# Patient Record
Sex: Female | Born: 1950 | Race: White | Hispanic: No | Marital: Married | Smoking: Former smoker
Health system: Southern US, Community
[De-identification: ages and names within clinical notes are randomized; demographics above are authoritative.]

## PROBLEM LIST (undated history)

## (undated) DIAGNOSIS — E785 Hyperlipidemia, unspecified: Secondary | ICD-10-CM

## (undated) DIAGNOSIS — I10 Essential (primary) hypertension: Secondary | ICD-10-CM

## (undated) DIAGNOSIS — F419 Anxiety disorder, unspecified: Secondary | ICD-10-CM

## (undated) DIAGNOSIS — M797 Fibromyalgia: Secondary | ICD-10-CM

## (undated) DIAGNOSIS — N3010 Interstitial cystitis (chronic) without hematuria: Secondary | ICD-10-CM

## (undated) DIAGNOSIS — Z862 Personal history of diseases of the blood and blood-forming organs and certain disorders involving the immune mechanism: Principal | ICD-10-CM

## (undated) DIAGNOSIS — E119 Type 2 diabetes mellitus without complications: Secondary | ICD-10-CM

## (undated) DIAGNOSIS — Z87898 Personal history of other specified conditions: Secondary | ICD-10-CM

## (undated) DIAGNOSIS — R7303 Prediabetes: Secondary | ICD-10-CM

## (undated) DIAGNOSIS — K219 Gastro-esophageal reflux disease without esophagitis: Secondary | ICD-10-CM

## (undated) DIAGNOSIS — N840 Polyp of corpus uteri: Secondary | ICD-10-CM

## (undated) DIAGNOSIS — Z972 Presence of dental prosthetic device (complete) (partial): Secondary | ICD-10-CM

## (undated) DIAGNOSIS — N281 Cyst of kidney, acquired: Secondary | ICD-10-CM

## (undated) DIAGNOSIS — K5790 Diverticulosis of intestine, part unspecified, without perforation or abscess without bleeding: Secondary | ICD-10-CM

## (undated) HISTORY — PX: COLONOSCOPY: SHX174

## (undated) HISTORY — DX: Hyperlipidemia, unspecified: E78.5

## (undated) HISTORY — DX: Diverticulosis of intestine, part unspecified, without perforation or abscess without bleeding: K57.90

## (undated) HISTORY — DX: Interstitial cystitis (chronic) without hematuria: N30.10

## (undated) HISTORY — PX: OTHER SURGICAL HISTORY: SHX169

## (undated) HISTORY — DX: Anxiety disorder, unspecified: F41.9

## (undated) HISTORY — DX: Personal history of diseases of the blood and blood-forming organs and certain disorders involving the immune mechanism: Z86.2

---

## 1974-01-11 HISTORY — PX: OTHER SURGICAL HISTORY: SHX169

## 2000-02-03 ENCOUNTER — Emergency Department (HOSPITAL_COMMUNITY): Admission: EM | Admit: 2000-02-03 | Discharge: 2000-02-03 | Payer: Self-pay | Admitting: Emergency Medicine

## 2000-03-18 ENCOUNTER — Other Ambulatory Visit: Admission: RE | Admit: 2000-03-18 | Discharge: 2000-03-18 | Payer: Self-pay | Admitting: Obstetrics and Gynecology

## 2000-08-14 ENCOUNTER — Emergency Department (HOSPITAL_COMMUNITY): Admission: EM | Admit: 2000-08-14 | Discharge: 2000-08-14 | Payer: Self-pay | Admitting: Emergency Medicine

## 2001-08-16 ENCOUNTER — Other Ambulatory Visit: Admission: RE | Admit: 2001-08-16 | Discharge: 2001-08-16 | Payer: Self-pay | Admitting: Internal Medicine

## 2002-06-08 ENCOUNTER — Emergency Department (HOSPITAL_COMMUNITY): Admission: EM | Admit: 2002-06-08 | Discharge: 2002-06-08 | Payer: Self-pay

## 2003-02-10 ENCOUNTER — Emergency Department (HOSPITAL_COMMUNITY): Admission: EM | Admit: 2003-02-10 | Discharge: 2003-02-10 | Payer: Self-pay | Admitting: Emergency Medicine

## 2003-02-20 ENCOUNTER — Ambulatory Visit (HOSPITAL_COMMUNITY): Admission: RE | Admit: 2003-02-20 | Discharge: 2003-02-20 | Payer: Self-pay | Admitting: *Deleted

## 2003-04-23 ENCOUNTER — Ambulatory Visit (HOSPITAL_COMMUNITY): Admission: RE | Admit: 2003-04-23 | Discharge: 2003-04-23 | Payer: Self-pay | Admitting: Obstetrics and Gynecology

## 2003-04-23 ENCOUNTER — Encounter (INDEPENDENT_AMBULATORY_CARE_PROVIDER_SITE_OTHER): Payer: Self-pay | Admitting: *Deleted

## 2006-11-22 ENCOUNTER — Other Ambulatory Visit: Admission: RE | Admit: 2006-11-22 | Discharge: 2006-11-22 | Payer: Self-pay | Admitting: Obstetrics and Gynecology

## 2010-02-11 ENCOUNTER — Encounter: Payer: Self-pay | Admitting: Gastroenterology

## 2010-02-11 ENCOUNTER — Other Ambulatory Visit: Payer: Self-pay | Admitting: Gastroenterology

## 2010-02-19 ENCOUNTER — Ambulatory Visit
Admission: RE | Admit: 2010-02-19 | Discharge: 2010-02-19 | Disposition: A | Discharge status: Home / Self Care | Payer: No Typology Code available for payment source | Source: Ambulatory Visit | Attending: Gastroenterology | Admitting: Gastroenterology

## 2010-02-19 ENCOUNTER — Other Ambulatory Visit: Payer: Self-pay | Admitting: Gastroenterology

## 2010-02-19 DIAGNOSIS — R935 Abnormal findings on diagnostic imaging of other abdominal regions, including retroperitoneum: Secondary | ICD-10-CM

## 2010-02-25 ENCOUNTER — Ambulatory Visit
Admission: RE | Admit: 2010-02-25 | Discharge: 2010-02-25 | Disposition: A | Discharge status: Home / Self Care | Payer: No Typology Code available for payment source | Source: Ambulatory Visit | Attending: Gastroenterology | Admitting: Gastroenterology

## 2010-02-25 DIAGNOSIS — R935 Abnormal findings on diagnostic imaging of other abdominal regions, including retroperitoneum: Secondary | ICD-10-CM

## 2010-02-25 MED ORDER — GADOBENATE DIMEGLUMINE 529 MG/ML IV SOLN: 15.0000 mL | Freq: Once | INTRAVENOUS | Status: AC | PRN

## 2010-05-29 NOTE — Op Note (Signed)
NAME:  Cathy Page, Cathy Page                        ACCOUNT NO.:  [id number]   MEDICAL RECORD NO.:  [id number]                   PATIENT TYPE:  AMB   LOCATION:  SDC                                  FACILITY:  [facility]   PHYSICIAN:  Charles A. Sydnee Cabal, MD            DATE OF BIRTH:  01/28/1950   DATE OF PROCEDURE:  04/23/2003  DATE OF DISCHARGE:                                 OPERATIVE REPORT   PREOPERATIVE DIAGNOSIS:  1. Post menopausal bleeding.  2. Endometrial polyp.   POSTOPERATIVE DIAGNOSIS:  1. Post menopausal bleeding.  2. Endometrial polyp.   PROCEDURE:  Hysteroscopy failed secondary to severely anteverted uterus,  could not pass the scope safely, paracervical block, dilation and curettage.   SURGEON:  Charles A. Delcambre, MD   ASSISTANT:  None.   COMPLICATIONS:  None.   ESTIMATED BLOOD LOSS:  Less than or equal to 20 mL.   OPERATIVE FINDINGS:  Sound to 8 cm anteverted uterus as noted above.   SPECIMENS:  Endometrial curettings/question polyp to pathology.   ANESTHESIA:  MAC plus paracervical block.   DESCRIPTION OF PROCEDURE:  The patient was taken to the operating room and  placed in supine position.  Anesthesia was dosed until adequate.  The  patient was then placed in the dorsal lithotomy position  in universal  stirrups and sterile prep and drape was undertaken.  A weighted speculum was  placed in the vagina.  The cervix was grasped with a single tooth tenaculum,  a paracervical block was placed, a total of 18 mL, with 1% plain lidocaine.  A sound was noted above, there was no evidence of perforation.  Hegar  dilators were used to dilate up to Hegar 25 without evidence of perforation.  Despite dilation, the scope could not be passed secondary to the severely  anteverted uterus.  Curved instruments had no difficulty passing, but the  straight scope would not pass.  The hysteroscopy was abandoned for that  reason.  Circumferential curet with a serrated banjo curet was  undertaken.  A moderate amount of tissue was obtained.  There was no evidence of  perforation during the procedure.  All instruments were removed and we  proceeded to take the patient to recovery with the physician in attendance.                                              Charles A. Sydnee Cabal, MD   CAD/MEDQ  D:  04/23/2003  T:  04/23/2003  Job:  [id number]

## 2010-05-29 NOTE — H&P (Signed)
NAME:  Cathy Page, Cathy Page                        ACCOUNT NO.:  [id number]   MEDICAL RECORD NO.:  [id number]                   PATIENT TYPE:  AMB   LOCATION:  SDC                                  [facility]ILITY:  [facility]   PHYSICIAN:  Charles A. Sydnee Cabal, MD            DATE OF BIRTH:  05/25/1950   DATE OF ADMISSION:  DATE OF DISCHARGE:                                HISTORY & PHYSICAL   HISTORY OF PRESENT ILLNESS:  She is a 60 year old gravida 3 para 2 female  who was noted on ultrasound to check an ovarian cyst to have an endometrium  of 6 mm.  Ovarian cyst was 5 x 6 mm, slight decrease in size from ultrasound  of January 25, 2003 on the right ovary.  This appeared stable on today's  ultrasound as well prior to the sonohysterogram which was recommended  secondary to the endometrial thickness of 6 mm.  On sonohysterogram a 20 x 9  x 15 mm large-walled polyp was noted.  I have recommended hysteroscopy D&C  based on this finding in a menopausal woman.  Endometrial biopsy was done  after the sonohysterogram; results pending.   PAST MEDICAL HISTORY:  1. Hypertension.  2. Hypothyroidism.   MEDICATIONS:  1. Synthroid 0.2 mg daily.  2. Tiazac 300 mg q.h.s.  3. Zestoretic 20/25 one q.a.m.   SURGICAL HISTORY:  1. Laparotomy for endometriosis.  2. Three C-sections.  3. Appendectomy.   ALLERGIES:  1. SULFA causes hives, swelling, and edema.  2. CODEINE - not specified.   SOCIAL HISTORY:  No tobacco, alcohol, or drugs, or STDs by history.  She is  married.   FAMILY HISTORY:  Father with diabetes mellitus, coronary artery disease, and  dementia.  Mother with hypertension, coronary artery disease.  Five sisters  alive and well.   REVIEW OF SYSTEMS:  No fever, chills, rashes, lesions, headache, dizziness.  Some seasonal allergies present.  No chest pain, shortness of breath, or  wheezing.  No diarrhea, constipation, bleeding, melena or hematochezia,  urgency, frequency, dysuria, incontinence,  hematuria, galactorrhea,  emotional changes.   PHYSICAL EXAMINATION:  GENERAL:  Alert and oriented x3, no distress.  VITAL SIGNS:  Blood pressure 160/82 on March 29, 2003.  Weight 164, heart  rate 80s, respiratory rate 18.  HEENT:  Grossly normal.  LUNGS:  Clear.  HEART:  Regular rate and rhythm.  BREASTS:  Symmetrical; otherwise deferred.  ABDOMEN:  Soft, flat, and nontender.  No masses palpable.  PELVIC:  Normal external female genitalia.  Bartholin, urethra, Skene's  within normal limits.  Vault without discharge or lesions. Urethral meatus  normal.  Urethra normal.  Bladder normal.  Vaginal normal and mildly  hypoestrogenic.  Cervix mildly hypoestrogenic, menopausal.  No cervical  motion tenderness is present.  Uterus not enlarged, anteverted.  Adnexa  nontender without masses bilaterally.  Sound was to 8 cm at time of  endometrial biopsy and sonohysterogram.  Rectal  exam not done.  External  hemorrhoids not grossly present.  Anus and perineal body appear normal.   ASSESSMENT:  Thickened endometrium in a postmenopausal woman otherwise  asymptomatic, incidental finding.  Sonohysterogram further confirms a large  endometrial polyp.   PLAN:  Hysteroscopy D&C with polypectomy.  N.p.o. past midnight.  Preoperative CBC and Chem 7.  The patient prefers MAC/paracervical  anesthesia.  Surgery scheduled for __________ via same day surgery at [facility].  All questions are answered.  She gives informed consent.  Accepts risks of  infection, bleeding, uterine perforation, bowel and bladder risks, blood  product risks including hepatitis and HIV exposure.  Will proceed as  outlined.                                               Charles A. Sydnee Cabal, MD    CAD/MEDQ  D:  04/12/2003  T:  04/12/2003  Job:  [id number]

## 2011-11-10 ENCOUNTER — Other Ambulatory Visit: Payer: Self-pay | Admitting: Gastroenterology

## 2012-10-27 ENCOUNTER — Emergency Department (HOSPITAL_COMMUNITY)
Admission: EM | Admit: 2012-10-27 | Discharge: 2012-10-28 | Disposition: A | Discharge status: Home / Self Care | Payer: BC Managed Care – PPO | Attending: Emergency Medicine | Admitting: Emergency Medicine

## 2012-10-27 ENCOUNTER — Other Ambulatory Visit: Payer: Self-pay

## 2012-10-27 ENCOUNTER — Encounter (HOSPITAL_COMMUNITY): Payer: Self-pay | Admitting: Emergency Medicine

## 2012-10-27 ENCOUNTER — Emergency Department (HOSPITAL_COMMUNITY): Payer: BC Managed Care – PPO

## 2012-10-27 DIAGNOSIS — K219 Gastro-esophageal reflux disease without esophagitis: Secondary | ICD-10-CM

## 2012-10-27 DIAGNOSIS — R079 Chest pain, unspecified: Secondary | ICD-10-CM

## 2012-10-27 DIAGNOSIS — R072 Precordial pain: Secondary | ICD-10-CM | POA: Insufficient documentation

## 2012-10-27 DIAGNOSIS — Z7982 Long term (current) use of aspirin: Secondary | ICD-10-CM | POA: Insufficient documentation

## 2012-10-27 DIAGNOSIS — I10 Essential (primary) hypertension: Secondary | ICD-10-CM | POA: Insufficient documentation

## 2012-10-27 DIAGNOSIS — Z79899 Other long term (current) drug therapy: Secondary | ICD-10-CM | POA: Insufficient documentation

## 2012-10-27 DIAGNOSIS — E119 Type 2 diabetes mellitus without complications: Secondary | ICD-10-CM | POA: Insufficient documentation

## 2012-10-27 HISTORY — DX: Essential (primary) hypertension: I10

## 2012-10-27 LAB — BASIC METABOLIC PANEL
BUN: 7 mg/dL (ref 6–23)
Chloride: 96 mEq/L (ref 96–112)
Creatinine, Ser: 0.62 mg/dL (ref 0.50–1.10)
GFR calc Af Amer: >90 mL/min (ref >90)
GFR calc non Af Amer: >90 mL/min (ref >90)
Potassium: 3.8 mEq/L (ref 3.5–5.1)

## 2012-10-27 LAB — CBC
HCT: 36.5 % (ref 36.0–46.0)
MCHC: 35.6 g/dL (ref 30.0–36.0)
Platelets: 333 10*3/uL (ref 150–400)
RDW: 12.1 % (ref 11.5–15.5)
WBC: 5.3 10*3/uL (ref 4.0–10.5)

## 2012-10-27 LAB — PRO B NATRIURETIC PEPTIDE: Pro B Natriuretic peptide (BNP): 87.5 pg/mL (ref 0–125)

## 2012-10-27 LAB — POCT I-STAT TROPONIN I

## 2012-10-27 MED ORDER — GI COCKTAIL ~~LOC~~
30.0000 mL | Freq: Once | ORAL | Status: AC
  Administered 2012-10-27: 30 mL via ORAL
  Filled 2012-10-27: qty 30

## 2012-10-27 MED ORDER — OMEPRAZOLE 20 MG PO CPDR: 20.0000 mg | DELAYED_RELEASE_CAPSULE | Freq: Every day | ORAL | Status: DC

## 2012-10-27 NOTE — ED Notes (Signed)
Pt reports that she started having midsternal chest pain with radiation into both arms. Reports some SOB and feeling nauseated. Denies any cardiac hx but reports she was placed on a holter monitor back in January.

## 2012-10-27 NOTE — ED Provider Notes (Signed)
CSN: [id number]     Arrival date & time 10/27/12  1425 History   First MD Initiated Contact with Patient 10/27/12 1631     Chief Complaint  Patient presents with  . Chest Pain   (Consider location/radiation/quality/duration/timing/severity/associated sxs/prior Treatment) HPI Patient reports developing midsternal chest pain that felt as a pressure with a burning sensation today.  It lasts approximately 45 minutes.  She was doing nothing at the time.  She denies exertional chest pain.  She's had this discomfort intermittently over the past year.  She seen her cardiologist about it had a stress test or monitor for a month without developing any reason for this.  She's had a heart catheterization before in the past demonstrating nonobstructive coronary disease.  She reports no exertional chest pain.  She denies orthopnea.  No recent fevers or chills.  No history of DVT or pulmonary blows up.  No recent long travel or surgery.  She denies unilateral leg swelling.  She states at this time she is feeling much better and her symptoms seem to be improving.  She does report that she's under a large amount of stress at this time.  She's had an endoscopy before in the past demonstrated no pathology.  She's not on any PPI.   Patient presents with Past Medical History  Diagnosis Date  . Hypertension   . Diabetes mellitus without complication    History reviewed. No pertinent past surgical history. History reviewed. No pertinent family history. History  Substance Use Topics  . Smoking status: Never Smoker   . Smokeless tobacco: Not on file  . Alcohol Use: Yes   OB History   Grav Para Term Preterm Abortions TAB SAB Ect Mult Living                 Review of Systems  All other systems reviewed and are negative.    Allergies  Sulfa antibiotics; Codeine; and Hydrocodone  Home Medications   Current Outpatient Rx  Name  Route  Sig  Dispense  Refill  . ALPRAZolam (XANAX) 1 MG tablet   Oral  Take 0.5 mg by mouth 2 (two) times daily as needed for anxiety.         Marland Kitchen aspirin 81 MG chewable tablet   Oral   Chew 81 mg by mouth daily.         . Cholecalciferol (VITAMIN D-3) 1000 UNITS CAPS   Oral   Take 3,000 Units by mouth daily.         . Coenzyme Q10 (CO Q 10 PO)   Oral   Take 1 tablet by mouth daily.         . diphenhydrAMINE (BENADRYL) 25 mg capsule   Oral   Take 25 mg by mouth daily as needed for allergies.         . fish oil-omega-3 fatty acids 1000 MG capsule   Oral   Take 2 g by mouth daily.         Marland Kitchen lisinopril (PRINIVIL,ZESTRIL) 20 MG tablet   Oral   Take 10 mg by mouth every morning.         . metFORMIN (GLUCOPHAGE) 500 MG tablet   Oral   Take 500 mg by mouth 2 (two) times daily with a meal.         . Multiple Vitamins-Minerals (MULTIVITAMIN PO)   Oral   Take 1 tablet by mouth daily.         Marland Kitchen thyroid (ARMOUR) 90 MG  tablet   Oral   Take 90 mg by mouth daily.         . vitamin C (ASCORBIC ACID) 500 MG tablet   Oral   Take 500 mg by mouth daily.         Marland Kitchen omeprazole (PRILOSEC) 20 MG capsule   Oral   Take 1 capsule (20 mg total) by mouth daily.   30 capsule   0    BP 112/68  Pulse 80  Temp(Src) 98.2 F (36.8 C) (Oral)  Resp 17  Wt 162 lb (73.483 kg)  SpO2 99% Physical Exam  Nursing note and vitals reviewed. Constitutional: She is oriented to person, place, and time. She appears well-developed and well-nourished. No distress.  HENT:  Head: Normocephalic and atraumatic.  Eyes: EOM are normal.  Neck: Normal range of motion.  Cardiovascular: Normal rate, regular rhythm and normal heart sounds.   Pulmonary/Chest: Effort normal and breath sounds normal.  Abdominal: Soft. She exhibits no distension. There is no tenderness.  Musculoskeletal: Normal range of motion.  Neurological: She is alert and oriented to person, place, and time.  Skin: Skin is warm and dry.  Psychiatric: She has a normal mood and affect. Judgment  normal.    ED Course  Procedures (including critical care time) Labs Review Labs Reviewed  BASIC METABOLIC PANEL - Abnormal; Notable for the following:    Sodium 133 (*)    Glucose, Bld 183 (*)    All other components within normal limits  CBC  PRO B NATRIURETIC PEPTIDE  TROPONIN I  POCT I-STAT TROPONIN I   Imaging Review Dg Chest 2 View  10/27/2012   CLINICAL DATA:  Left-sided chest pain.  EXAM: CHEST  2 VIEW  COMPARISON:  None  FINDINGS: Normal cardiac and mediastinal contours. Minimal left basilar atelectasis. No consolidative pulmonary opacity. No pleural effusion or pneumothorax. Regional skeleton is unremarkable.  IMPRESSION: No acute cardiopulmonary process.   Electronically Signed   By: Annia Belt M.D.   On: 10/27/2012 14:52    ECG interpretation   Date: 10/27/2012  Rate: 126  Rhythm: normal sinus rhythm  QRS Axis: normal  Intervals: normal  ST/T Wave abnormalities: normal  Conduction Disutrbances: none  Narrative Interpretation:   Old EKG Reviewed: No significant changes noted     MDM   1. Chest pain   2. GERD (gastroesophageal reflux disease)    7:14 PM Patient feels much better after GI cocktail.  Troponin negative x2.  EKG with sinus tachycardia.  Her sinus tachycardia resolved at time of discharge her heart rate was 87.    Lyanne Co, MD 10/27/12 [phone]

## 2012-11-14 DIAGNOSIS — N3010 Interstitial cystitis (chronic) without hematuria: Secondary | ICD-10-CM | POA: Insufficient documentation

## 2012-11-22 ENCOUNTER — Encounter: Payer: Self-pay | Admitting: Cardiology

## 2012-11-22 ENCOUNTER — Encounter: Payer: Self-pay | Admitting: *Deleted

## 2012-11-22 DIAGNOSIS — E119 Type 2 diabetes mellitus without complications: Secondary | ICD-10-CM | POA: Insufficient documentation

## 2012-11-22 DIAGNOSIS — I10 Essential (primary) hypertension: Secondary | ICD-10-CM | POA: Insufficient documentation

## 2012-11-23 ENCOUNTER — Encounter: Payer: Self-pay | Admitting: Cardiology

## 2012-11-23 ENCOUNTER — Ambulatory Visit (INDEPENDENT_AMBULATORY_CARE_PROVIDER_SITE_OTHER): Payer: BC Managed Care – PPO | Admitting: Cardiology

## 2012-11-23 VITALS — BP 144/100 | HR 112 | Ht 64.0 in | Wt 164.0 lb

## 2012-11-23 DIAGNOSIS — R079 Chest pain, unspecified: Secondary | ICD-10-CM

## 2012-11-23 DIAGNOSIS — I10 Essential (primary) hypertension: Secondary | ICD-10-CM

## 2012-11-23 DIAGNOSIS — E785 Hyperlipidemia, unspecified: Secondary | ICD-10-CM | POA: Insufficient documentation

## 2012-11-23 DIAGNOSIS — R002 Palpitations: Secondary | ICD-10-CM

## 2012-11-23 LAB — BASIC METABOLIC PANEL
BUN: 13 mg/dL (ref 6–23)
CO2: 28 mEq/L (ref 19–32)
Calcium: 9.3 mg/dL (ref 8.4–10.5)
Chloride: 102 mEq/L (ref 96–112)
Creatinine, Ser: 0.8 mg/dL (ref 0.4–1.2)

## 2012-11-23 LAB — CBC
HCT: 39.7 % (ref 36.0–46.0)
MCV: 89.3 fl (ref 78.0–100.0)
RDW: 13 % (ref 11.5–14.6)

## 2012-11-23 LAB — TSH: TSH: 0.13 u[IU]/mL — ABNORMAL LOW (ref 0.35–5.50)

## 2012-11-23 NOTE — Patient Instructions (Signed)
Your physician recommends that you go to the lab today for a BMET, CBC, and TSH  Your physician has requested that you have en exercise stress myoview. For further information please visit [email or web address] Please follow instruction sheet, as given.  Your physician has recommended that you wear an event monitor. Event monitors are medical devices that record the heart's electrical activity. Doctors most often [place] these monitors to diagnose arrhythmias. Arrhythmias are problems with the speed or rhythm of the heartbeat. The monitor is a small, portable device. You can wear one while you do your normal daily activities. This is usually used to diagnose what is causing palpitations/syncope (passing out).

## 2012-11-23 NOTE — Progress Notes (Signed)
[address] [place], [place]  [address] Phone: [phone] Fax:  [phone]  Date:  11/23/2012   ID:  BRECKLYNN JIAN, DOB 10/19/1950, MRN [id number]  PCP:  Elie Goody, NP  Cardiologist:  Armanda Magic, MD   HPI:  This is a 62yo female with a history of HTN who recently went to the ER.  The patient reports developing midsternal chest pain that felt as a pressure with a burning sensation. It lasted approximately 45 minutes. She was doing nothing at the time. She denied exertional chest pain. She had this discomfort intermittently over the past year. She has had a stress test and had a heart catheterization before in the past demonstrating nonobstructive coronary disease quite a while ago. She has some mild nausea and SOB at the time.  She reported no exertional chest pain. She denied orthopnea. No recent fevers or chills. No history of DVT or pulmonary blows up. No recent long travel or surgery. She does report that she's under a large amount of stress at this time. Her ER workup was normal and she was referred for further evaluation.  Since the Er visit she has not had any further CP or SOB.  She also says that she was having palpitations and was told that her HR was in the 120's.  She occasionally gets a sharp pain in her epigastrium that radiates into her back.  She says that she is under tremendous.   History of Present Illness:  Wt Readings from Last 3 Encounters:  11/23/12 164 lb (74.39 kg)  10/27/12 162 lb (73.483 kg)     Past Medical History  Diagnosis Date  . Hypertension   . Diabetes mellitus without complication   . Hyperlipidemia   . Hypercholesteremia   . Hypothyroidism   . Anxiety   . Abdominal pain, left lower quadrant   . Diverticulosis   . Hypothyroidism     Current Outpatient Prescriptions  Medication Sig Dispense Refill  . ALPRAZolam (XANAX) 1 MG tablet Take 0.5 mg by mouth 2 (two) times daily as needed for anxiety.      Marland Kitchen aspirin 81 MG  chewable tablet Chew 81 mg by mouth daily.      . Cholecalciferol (VITAMIN D-3) 1000 UNITS CAPS Take 3,000 Units by mouth daily.      . Coenzyme Q10 (CO Q 10 PO) Take 1 tablet by mouth daily.      . diphenhydrAMINE (BENADRYL) 25 mg capsule Take 25 mg by mouth daily as needed for allergies.      . fish oil-omega-3 fatty acids 1000 MG capsule Take 2 g by mouth daily.      Marland Kitchen lisinopril (PRINIVIL,ZESTRIL) 20 MG tablet Take 10 mg by mouth every morning.      . metFORMIN (GLUCOPHAGE) 500 MG tablet Take 500 mg by mouth 2 (two) times daily with a meal.      . Multiple Vitamins-Minerals (MULTIVITAMIN PO) Take 1 tablet by mouth daily.      Marland Kitchen omeprazole (PRILOSEC) 20 MG capsule Take 1 capsule (20 mg total) by mouth daily.  30 capsule  0  . thyroid (ARMOUR) 90 MG tablet Take 90 mg by mouth daily.      . vitamin C (ASCORBIC ACID) 500 MG tablet Take 500 mg by mouth daily.       No current facility-administered medications for this visit.    Allergies:    Allergies  Allergen Reactions  . Sulfa Antibiotics Anaphylaxis  . Codeine Nausea  And Vomiting  . Hydrocodone Nausea And Vomiting    Social History:  The patient  reports that she has never smoked. She does not have any smokeless tobacco history on file. She reports that she drinks alcohol. She reports that she does not use illicit drugs.   Family History:  The patient's family history includes CAD in her mother; Diabetes in her father.   ROS:  Please see the history of present illness.      All other systems reviewed and negative.   PHYSICAL EXAM: VS:  BP 144/100  Pulse 112  Ht 5\' 4"  (1.626 m)  Wt 164 lb (74.39 kg)  BMI 28.14 kg/m2 Well nourished, well developed, in no acute distress HEENT: normal Neck: no JVD Cardiac:  normal S1, S2; RRR; no murmurtachy Lungs:  clear to auscultation bilaterally, no wheezing, rhonchi or rales Abd: soft, nontender, no hepatomegaly Ext: no edema Skin: warm and dry Neuro:  CNs 2-12 intact, no focal  abnormalities noted       ASSESSMENT AND PLAN:  1. Chest pain - now resolved   - stress myoview to rule out ischemia 2. SOB - now resolved 3. HTN - BP is elevated but patient is very stressed and anxious today  - continue Lisinopril 4. Palpitations ? Secondary to stress and anxiety  - event monitor  - check TSH/CBC/BMET  Followup with me PRN if stress test was fine

## 2012-11-24 ENCOUNTER — Telehealth: Payer: Self-pay | Admitting: Hematology and Oncology
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
## 2012-11-24 NOTE — Telephone Encounter (Signed)
C/D 11/24/12 for appt. 11/30/12

## 2012-11-24 NOTE — Telephone Encounter (Signed)
S/W PT IN REF TO NP APPT. ON 11/30/12@9 :30 REFERRING DR Consuelo Pandy DX-ELEVATED WBC'S,LIVER ENZYMES,LIPOPROTEIN (A) MAILED NP PACKET

## 2012-11-27 ENCOUNTER — Other Ambulatory Visit: Payer: Self-pay | Admitting: Nurse Practitioner

## 2012-11-27 DIAGNOSIS — R109 Unspecified abdominal pain: Secondary | ICD-10-CM

## 2012-11-27 DIAGNOSIS — R102 Pelvic and perineal pain: Secondary | ICD-10-CM

## 2012-11-28 ENCOUNTER — Ambulatory Visit (INDEPENDENT_AMBULATORY_CARE_PROVIDER_SITE_OTHER): Payer: BC Managed Care – PPO | Admitting: *Deleted

## 2012-11-28 ENCOUNTER — Ambulatory Visit (HOSPITAL_COMMUNITY): Payer: BC Managed Care – PPO

## 2012-11-28 ENCOUNTER — Encounter: Payer: Self-pay | Admitting: Radiology

## 2012-11-28 ENCOUNTER — Ambulatory Visit (HOSPITAL_COMMUNITY): Payer: BC Managed Care – PPO | Attending: Cardiology | Admitting: Radiology

## 2012-11-28 VITALS — BP 120/96 | Ht 64.0 in | Wt 160.0 lb

## 2012-11-28 DIAGNOSIS — Z8249 Family history of ischemic heart disease and other diseases of the circulatory system: Secondary | ICD-10-CM | POA: Insufficient documentation

## 2012-11-28 DIAGNOSIS — R002 Palpitations: Secondary | ICD-10-CM | POA: Insufficient documentation

## 2012-11-28 DIAGNOSIS — E119 Type 2 diabetes mellitus without complications: Secondary | ICD-10-CM | POA: Insufficient documentation

## 2012-11-28 DIAGNOSIS — R42 Dizziness and giddiness: Secondary | ICD-10-CM | POA: Insufficient documentation

## 2012-11-28 DIAGNOSIS — R079 Chest pain, unspecified: Secondary | ICD-10-CM

## 2012-11-28 DIAGNOSIS — I10 Essential (primary) hypertension: Secondary | ICD-10-CM | POA: Insufficient documentation

## 2012-11-28 DIAGNOSIS — I4949 Other premature depolarization: Secondary | ICD-10-CM

## 2012-11-28 DIAGNOSIS — R0789 Other chest pain: Secondary | ICD-10-CM | POA: Insufficient documentation

## 2012-11-28 DIAGNOSIS — E785 Hyperlipidemia, unspecified: Secondary | ICD-10-CM | POA: Insufficient documentation

## 2012-11-28 DIAGNOSIS — R0989 Other specified symptoms and signs involving the circulatory and respiratory systems: Secondary | ICD-10-CM | POA: Insufficient documentation

## 2012-11-28 DIAGNOSIS — R0609 Other forms of dyspnea: Secondary | ICD-10-CM | POA: Insufficient documentation

## 2012-11-28 DIAGNOSIS — R0602 Shortness of breath: Secondary | ICD-10-CM

## 2012-11-28 MED ORDER — TECHNETIUM TC 99M SESTAMIBI GENERIC - CARDIOLITE
11.0000 | Freq: Once | INTRAVENOUS | Status: AC | PRN
  Administered 2012-11-28: 11 via INTRAVENOUS

## 2012-11-28 MED ORDER — TECHNETIUM TC 99M SESTAMIBI GENERIC - CARDIOLITE
33.0000 | Freq: Once | INTRAVENOUS | Status: AC | PRN
  Administered 2012-11-28: 33 via INTRAVENOUS

## 2012-11-28 NOTE — Progress Notes (Deleted)
Exercise Treadmill Test  Pre-Exercise Testing Evaluation Rhythm: normal sinus  Rate: 76 bpm     Test  Exercise Tolerance Test Ordering MD: Angelina Sheriff, MD  Interpreting MD: Norma Fredrickson, NP  Unique Test No: 1  Treadmill:  1  Indication for ETT: known ASHD  Contraindication to ETT: No   Stress Modality: exercise - treadmill  Cardiac Imaging Performed: non   Protocol: standard Bruce - maximal  Max BP:  ***/***  Max MPHR (bpm):  159 85% MPR (bpm):  135  MPHR obtained (bpm):  *** % MPHR obtained:  ***  Reached 85% MPHR (min:sec):  *** Total Exercise Time (min-sec):  ***  Workload in METS:  *** Borg Scale: ***  Reason ETT Terminated:  {CHL REASON TERMINATED FOR ZOX:09604540}    ST Segment Analysis At Rest: {CHL ST SEGMENT AT REST FOR JWJ:19147829} With Exercise: {CHL ST SEGMENT WITH EXERCISE FOR FAO:13086578}  Other Information Arrhythmia:  {CHL ARRHYTHMIA FOR ION:62952841} Angina during ETT:  {CHL ANGINA DURING LKG:40102725} Quality of ETT:  {CHL QUALITY OF DGU:44034742}  ETT Interpretation:  {CHL INTERPRETATION FOR VZD:63875643}  Comments: ***  Recommendations: ***

## 2012-11-28 NOTE — Progress Notes (Signed)
Patient ID: Cathy Page, female   DOB: 09-22-1950, 62 y.o.   MRN: [id number] lifewatch 30 day monitor applied

## 2012-11-28 NOTE — Progress Notes (Signed)
[facility] SITE 3 NUCLEAR MED [address] [place], [place] [address] [phone]    Cardiology Nuclear Med Study  Cathy Page is a 62 y.o. female     MRN : [id number]     DOB: [birth year]  Procedure Date: 11/28/2012  Nuclear Med Background Indication for Stress Test:  Evaluation for Ischemia History:  '09 MPI: normal Cardiac Risk Factors: Family History - CAD, Hypertension, Lipids and NIDDM  Symptoms:  Chest Pain at Rest and with Exertion (last date of chest discomfort 4 days ago), Dizziness, DOE and Palpitations   Nuclear Pre-Procedure Caffeine/Decaff Intake:  None > 12 hrs NPO After: 8:00am   Lungs:  clear O2 Sat: 94% on room air. IV 0.9% NS with Angio Cath:  22g  IV Site: R Antecubital x 1, tolerated well IV Started by:  Irean Hong, RN  Chest Size (in):  38 Cup Size: DD  Height: 5\' 4"  (1.626 m)  Weight:  160 lb (72.576 kg)  BMI:  Body mass index is 27.45 kg/(m^2). Tech Comments:  Took Metformin with breakfast    Nuclear Med Study 1 or 2 day study: 1 day  Stress Test Type:  Stress  Reading MD: Armanda Magic, MD  Order Authorizing Provider:  Armanda Magic, MD  Resting Radionuclide: Technetium 28m Sestamibi  Resting Radionuclide Dose: 10.5 mCi   Stress Radionuclide:  Technetium 54m Sestamibi  Stress Radionuclide Dose: 32.8 mCi           Stress Protocol Rest HR: 89 Stress HR: 141  Rest BP: 120/96 Stress BP: 149/78  Exercise Time (min): 7:01 METS: 8.5   Predicted Max HR: 156 bpm % Max HR: 90.38 bpm Rate Pressure Product: 21308   Dose of Adenosine (mg):  n/a Dose of Lexiscan: n/a mg  Dose of Atropine (mg): n/a Dose of Dobutamine: n/a mcg/kg/min (at max HR)  Stress Test Technologist: Cathlyn Parsons, RN  Nuclear Technologist:  Dario Guardian, CNMT     Rest Procedure:  Myocardial perfusion imaging was performed at rest 45 minutes following the intravenous administration of Technetium 21m Sestamibi. Rest ECG: NSR with PVC's  Stress Procedure:   The patient exercised on the treadmill utilizing the Bruce Protocol for 7:01 minutes. The patient stopped due to fatigue and moderate SOB and denied any chest pain.  Technetium 52m Sestamibi was injected at peak exercise and myocardial perfusion imaging was performed after a brief delay. Stress ECG: No significant change from baseline ECG  QPS Raw Data Images:  Normal; no motion artifact; normal heart/lung ratio. Stress Images:  Normal homogeneous uptake in all areas of the myocardium. Rest Images:  Normal homogeneous uptake in all areas of the myocardium. Subtraction (SDS):  No evidence of ischemia. Transient Ischemic Dilatation (Normal <1.22):  0.91 Lung/Heart Ratio (Normal <0.45):  0.34  Quantitative Gated Spect Images QGS EDV:  69 ml QGS ESV:  21 ml  Impression Exercise Capacity:  Good exercise capacity. BP Response:  Normal blood pressure response. Clinical Symptoms:  There is dyspnea. ECG Impression:  No significant ST segment change suggestive of ischemia. Comparison with Prior Nuclear Study: No images to compare  Overall Impression:  Normal stress nuclear study.  LV Ejection Fraction: 69%.  LV Wall Motion:  NL LV Function; NL Wall Motion  Signed: Armanda Magic, MD 11/29/2012

## 2012-11-30 ENCOUNTER — Encounter: Payer: Self-pay | Admitting: Hematology and Oncology

## 2012-11-30 ENCOUNTER — Ambulatory Visit: Payer: BC Managed Care – PPO

## 2012-11-30 ENCOUNTER — Encounter (INDEPENDENT_AMBULATORY_CARE_PROVIDER_SITE_OTHER): Payer: Self-pay

## 2012-11-30 ENCOUNTER — Ambulatory Visit (HOSPITAL_BASED_OUTPATIENT_CLINIC_OR_DEPARTMENT_OTHER): Payer: BC Managed Care – PPO | Admitting: Hematology and Oncology

## 2012-11-30 VITALS — BP 138/76 | HR 88 | Temp 97.8°F | Resp 20 | Ht 64.0 in | Wt 164.0 lb

## 2012-11-30 DIAGNOSIS — E785 Hyperlipidemia, unspecified: Secondary | ICD-10-CM

## 2012-11-30 DIAGNOSIS — R748 Abnormal levels of other serum enzymes: Secondary | ICD-10-CM

## 2012-11-30 DIAGNOSIS — Z862 Personal history of diseases of the blood and blood-forming organs and certain disorders involving the immune mechanism: Secondary | ICD-10-CM | POA: Insufficient documentation

## 2012-11-30 HISTORY — DX: Personal history of diseases of the blood and blood-forming organs and certain disorders involving the immune mechanism: Z86.2

## 2012-11-30 NOTE — Progress Notes (Signed)
[facility] CONSULT NOTE  Patient Care Team: Elie Goody as PCP - General (Nurse Practitioner) Artis Delay, MD as Consulting Physician (Hematology and Oncology)  CHIEF COMPLAINTS/PURPOSE OF CONSULTATION:  Abnormal CBC with leukocytosis and neutrophilia  HISTORY OF PRESENTING ILLNESS:  Cathy Page 62 y.o. female is here because of abnormal CBC. According to the patient, she had CBC drawn on a routine basis. According to chart review, CBC collected on 10/25/2012 show a white count of uterine 0.3, 8.9 absolute neutrophilia, C-reactive protein at 146, elevated alkaline phosphatase, ALT, GGT, and lipoprotein (a) At the time the blood was drawn, she was complaining of dysuria, frequency and urgency. According to the patient, she had history of intermittent interstitial cystitis and has seen a urologist recently. Repeat urinalysis came back positive for blood in the urine, in association with mild tachycardia, she was diagnosed with possible urinary tract infection and was placed on Macrobid for 5 days She denies any further urinary symptoms today. She denies any recent fever, chills, night sweats or abnormal weight loss  MEDICAL HISTORY:  Past Medical History  Diagnosis Date  . Diabetes mellitus without complication   . Hypothyroidism   . Anxiety   . Abdominal pain, left lower quadrant   . Diverticulosis   . Hypothyroidism   . Hypertension   . Hyperlipidemia   . Hypercholesteremia   . Interstitial cystitis     Chronic, intermittent    SURGICAL HISTORY: Past Surgical History  Procedure Laterality Date  . Cesarean section    . Appendectomy    . Laparatomy-endometriosis    . Cardiac catheterization      SOCIAL HISTORY: History   Social History  . Marital Status: Married    Spouse Name: N/A    Number of Children: N/A  . Years of Education: N/A   Occupational History  . Not on file.   Social History Main Topics  . Smoking status: Never Smoker   .  Smokeless tobacco: Never Used  . Alcohol Use: 3.0 oz/week    5 Glasses of wine per week  . Drug Use: No  . Sexual Activity: Not on file   Other Topics Concern  . Not on file   Social History Narrative  . No narrative on file    FAMILY HISTORY: Family History  Problem Relation Age of Onset  . Diabetes Father   . CAD Mother     ALLERGIES:  is allergic to sulfa antibiotics; codeine; and hydrocodone.  MEDICATIONS:  Current Outpatient Prescriptions  Medication Sig Dispense Refill  . ALPRAZolam (XANAX) 1 MG tablet Take 0.5 mg by mouth 2 (two) times daily as needed for anxiety.      Marland Kitchen aspirin 81 MG chewable tablet Chew 81 mg by mouth daily.      . Cholecalciferol (VITAMIN D-3) 1000 UNITS CAPS Take 3,000 Units by mouth daily.      . Coenzyme Q10 (CO Q 10 PO) Take 1 tablet by mouth daily.      . diphenhydrAMINE (BENADRYL) 25 mg capsule Take 25 mg by mouth daily as needed for allergies.      . Estradiol 0.5 MG/0.5GM GEL Place onto the skin.      . fish oil-omega-3 fatty acids 1000 MG capsule Take 2 g by mouth daily.      Marland Kitchen liothyronine (CYTOMEL) 5 MCG tablet Take 5 mcg by mouth daily.      Marland Kitchen lisinopril (PRINIVIL,ZESTRIL) 20 MG tablet Take 10 mg by mouth every morning.      Marland Kitchen  metFORMIN (GLUCOPHAGE) 500 MG tablet Take 500 mg by mouth 2 (two) times daily with a meal.      . Multiple Vitamins-Minerals (MULTIVITAMIN PO) Take 1 tablet by mouth daily.      . progesterone (PROMETRIUM) 100 MG capsule Take 100 mg by mouth daily.      . Red Yeast Rice 600 MG CAPS Take 2 capsules by mouth.      . thyroid (ARMOUR) 90 MG tablet Take 90 mg by mouth daily.      Marland Kitchen omeprazole (PRILOSEC) 20 MG capsule Take 1 capsule (20 mg total) by mouth daily.  30 capsule  0  . vitamin C (ASCORBIC ACID) 500 MG tablet Take 500 mg by mouth daily.       No current facility-administered medications for this visit.    REVIEW OF SYSTEMS:   Constitutional: Denies fevers, chills or abnormal night sweats Eyes: Denies  blurriness of vision, double vision or watery eyes Ears, nose, mouth, throat, and face: Denies mucositis or sore throat Respiratory: Denies cough, dyspnea or wheezes Cardiovascular: Denies palpitation, chest discomfort or lower extremity swelling Gastrointestinal:  Denies nausea, heartburn or change in bowel habits Skin: Denies abnormal skin rashes Lymphatics: Denies new lymphadenopathy or easy bruising Neurological:Denies numbness, tingling or new weaknesses Behavioral/Psych: Mood is stable, no new changes  All other systems were reviewed with the patient and are negative.  PHYSICAL EXAMINATION: ECOG PERFORMANCE STATUS: 0 - Asymptomatic  Filed Vitals:   11/30/12 0941  BP: 138/76  Pulse: 88  Temp: 97.8 F (36.6 C)  Resp: 20   Filed Weights   11/30/12 0941  Weight: 164 lb (74.39 kg)    GENERAL:alert, no distress and comfortable SKIN: skin color, texture, turgor are normal, no rashes or significant lesions EYES: normal, conjunctiva are pink and non-injected, sclera clear OROPHARYNX:no exudate, no erythema and lips, buccal mucosa, and tongue normal  NECK: supple, thyroid normal size, non-tender, without nodularity LYMPH:  no palpable lymphadenopathy in the cervical, axillary or inguinal LUNGS: clear to auscultation and percussion with normal breathing effort HEART: regular rate & rhythm and no murmurs and no lower extremity edema ABDOMEN:abdomen soft, non-tender and normal bowel sounds Musculoskeletal:no cyanosis of digits and no clubbing  PSYCH: alert & oriented x 3 with fluent speech NEURO: no focal motor/sensory deficits  LABORATORY DATA:  I have reviewed the data as listed Recent Results (from the past 2160 hour(s))  BASIC METABOLIC PANEL     Status: Abnormal   Collection Time    10/27/12  2:31 PM      Result Value Range   Sodium 133 (*) 135 - 145 mEq/L   Potassium 3.8  3.5 - 5.1 mEq/L   Chloride 96  96 - 112 mEq/L   CO2 25  19 - 32 mEq/L   Glucose, Bld 183 (*) 70  - 99 mg/dL   BUN 7  6 - 23 mg/dL   Creatinine, Ser 1.61  0.50 - 1.10 mg/dL   Calcium 9.1  8.4 - 09.6 mg/dL   GFR calc non Af Amer >90  >90 mL/min   GFR calc Af Amer >90  >90 mL/min   Comment: (NOTE)     The eGFR has been calculated using the CKD EPI equation.     This calculation has not been validated in all clinical situations.     eGFR's persistently <90 mL/min signify possible Chronic Kidney     Disease.  PRO B NATRIURETIC PEPTIDE     Status: None   Collection  Time    10/27/12  2:31 PM      Result Value Range   Pro B Natriuretic peptide (BNP) 87.5  0 - 125 pg/mL  POCT I-STAT TROPONIN I     Status: None   Collection Time    10/27/12  2:41 PM      Result Value Range   Troponin i, poc 0.00  0.00 - 0.08 ng/mL   Comment 3            Comment: Due to the release kinetics of cTnI,     a negative result within the first hours     of the onset of symptoms does not rule out     myocardial infarction with certainty.     If myocardial infarction is still suspected,     repeat the test at appropriate intervals.  CBC     Status: None   Collection Time    10/27/12  2:55 PM      Result Value Range   WBC 5.3  4.0 - 10.5 K/uL   RBC 4.09  3.87 - 5.11 MIL/uL   Hemoglobin 13.0  12.0 - 15.0 g/dL   HCT 16.1  09.6 - 04.5 %   MCV 89.2  78.0 - 100.0 fL   MCH 31.8  26.0 - 34.0 pg   MCHC 35.6  30.0 - 36.0 g/dL   RDW 40.9  81.1 - 91.4 %   Platelets 333  150 - 400 K/uL  TROPONIN I     Status: None   Collection Time    10/27/12  5:18 PM      Result Value Range   Troponin I <0.30  <0.30 ng/mL   Comment:            Due to the release kinetics of cTnI,     a negative result within the first hours     of the onset of symptoms does not rule out     myocardial infarction with certainty.     If myocardial infarction is still suspected,     repeat the test at appropriate intervals.  BASIC METABOLIC PANEL     Status: Abnormal   Collection Time    11/23/12  3:11 PM      Result Value Range   Sodium  137  135 - 145 mEq/L   Potassium 3.9  3.5 - 5.1 mEq/L   Chloride 102  96 - 112 mEq/L   CO2 28  19 - 32 mEq/L   Glucose, Bld 100 (*) 70 - 99 mg/dL   BUN 13  6 - 23 mg/dL   Creatinine, Ser 0.8  0.4 - 1.2 mg/dL   Calcium 9.3  8.4 - 78.2 mg/dL   GFR 95.62  >13.08 mL/min  CBC     Status: None   Collection Time    11/23/12  3:11 PM      Result Value Range   WBC 8.3  4.5 - 10.5 K/uL   RBC 4.45  3.87 - 5.11 Mil/uL   Platelets 268.0  150.0 - 400.0 K/uL   Hemoglobin 13.8  12.0 - 15.0 g/dL   HCT 65.7  84.6 - 96.2 %   MCV 89.3  78.0 - 100.0 fl   MCHC 34.7  30.0 - 36.0 g/dL   RDW 95.2  84.1 - 32.4 %  TSH     Status: Abnormal   Collection Time    11/23/12  3:11 PM      Result Value Range  TSH 0.13 (*) 0.35 - 5.50 uIU/mL   ASSESSMENT:  #1 recent leukocytosis, resolved #2 history of interstitial cystitis  PLAN:  #1 recent leukocytosis, resolved #2 history of interstitial cystitis I reassured the patient. Her most recent 2 CBC showed normal white count. It is possible at the time her CBC was drawn, she had evidence of urinary tract infection. That would explain the elevated CRP. She was treated with antibiotics and this has resolved. No further workup is needed. #3 abnormal liver function test #4 hyperlipidemia The patient had significant alcohol intake on a regular basis. She was told to take wine for cardiac protection. I suspect the elevated liver function tests is related to recent alcohol intake. I recommend she reduce alcohol intake. I recommend she followup with her primary care provider to have her liver function tests rechecked in the near future. The hyperlipidemia could sometimes also cause abnormal liver function tests do to fatty liver change. I reassured the patient   All questions were answered. The patient knows to call the clinic with any problems, questions or concerns. I spent 30 minutes counseling the patient face to face. The total time spent in the appointment was 40  minutes and more than 50% was on counseling.     Maclin Guerrette, MD 11/30/2012 10:51 AM

## 2012-12-01 ENCOUNTER — Ambulatory Visit
Admission: RE | Admit: 2012-12-01 | Discharge: 2012-12-01 | Disposition: A | Discharge status: Home / Self Care | Payer: BC Managed Care – PPO | Source: Ambulatory Visit | Attending: Nurse Practitioner | Admitting: Nurse Practitioner

## 2012-12-01 DIAGNOSIS — R102 Pelvic and perineal pain: Secondary | ICD-10-CM

## 2012-12-01 DIAGNOSIS — R109 Unspecified abdominal pain: Secondary | ICD-10-CM

## 2013-01-15 ENCOUNTER — Telehealth: Payer: Self-pay | Admitting: Cardiology

## 2013-01-15 NOTE — Telephone Encounter (Signed)
LVm for pt to return call.  

## 2013-01-15 NOTE — Telephone Encounter (Signed)
Please let patient know that heart monitor showed normal rhythm with occasional extra heart beats from the bottom of the heart which are benign

## 2013-01-23 NOTE — Telephone Encounter (Signed)
That is fine 

## 2013-01-23 NOTE — Telephone Encounter (Signed)
Pt is aware. Pt stated when she lays down she feels like she can hear her heart. She wanted to know if that was ok? She also stated she was going to start walking again.

## 2013-01-24 NOTE — Telephone Encounter (Signed)
Pt is aware.  

## 2014-01-10 IMAGING — US US ABDOMEN COMPLETE
1 series · 14 of 25 positions shown · non-contrast
Comparison: MRI liver 02/25/2010, abdominal ultrasound 02/19/2010

CLINICAL DATA: Abdominal pain

EXAM:
ULTRASOUND ABDOMEN COMPLETE

[Series 1: us abdomen complete · 0.28mm/px · 14 of 82 slices shown]
[im 1/82]
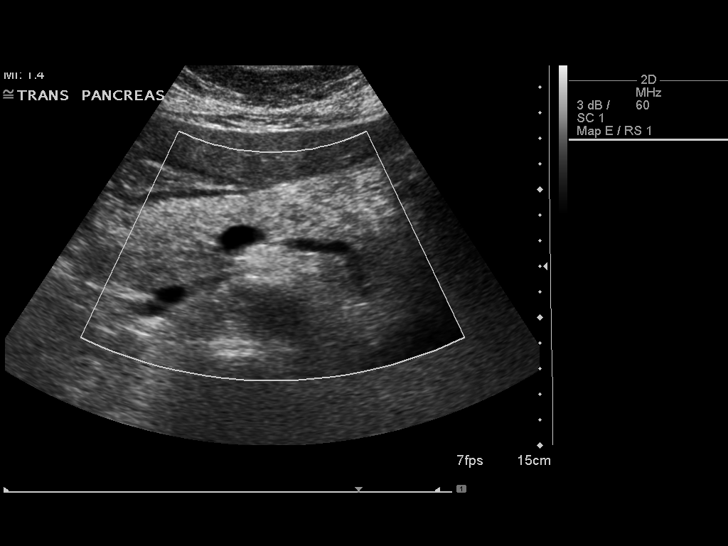
[im 7/82]
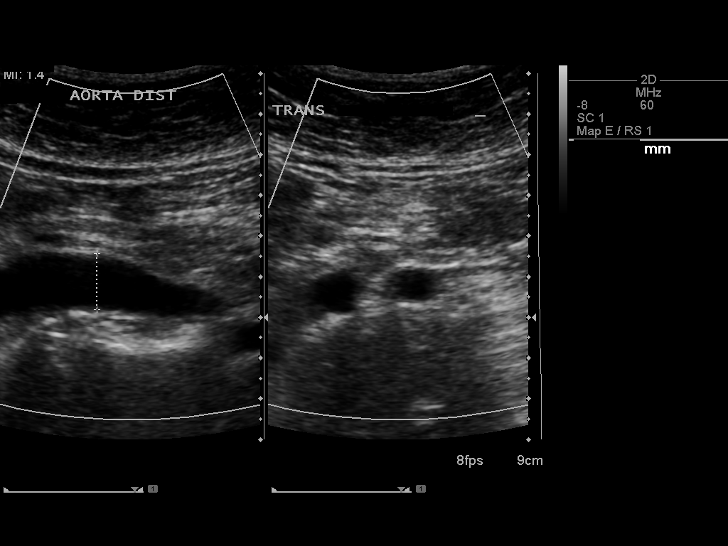
[im 14/82]
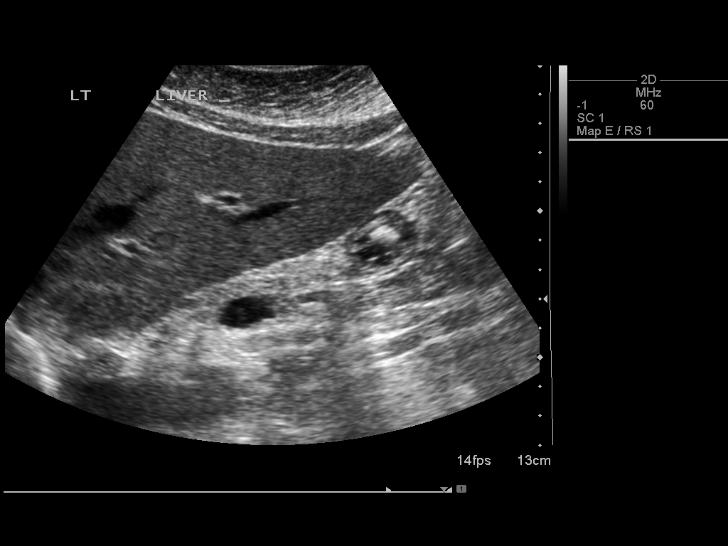
[im 21/82]
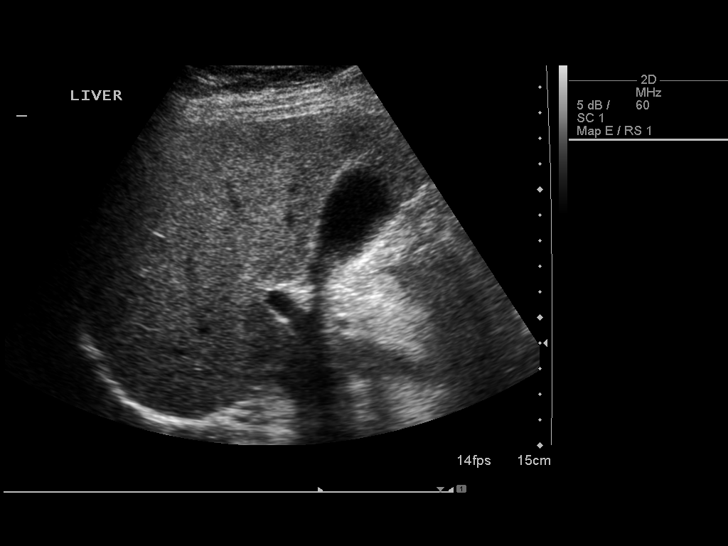
[im 28/82]
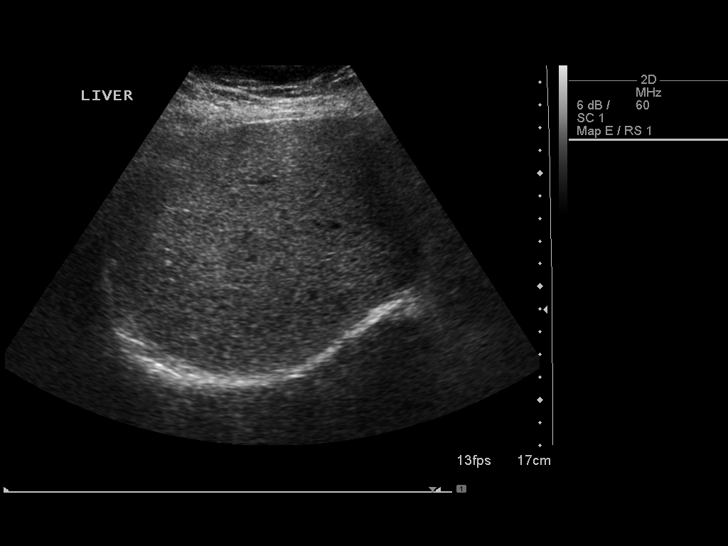
[im 31/82]
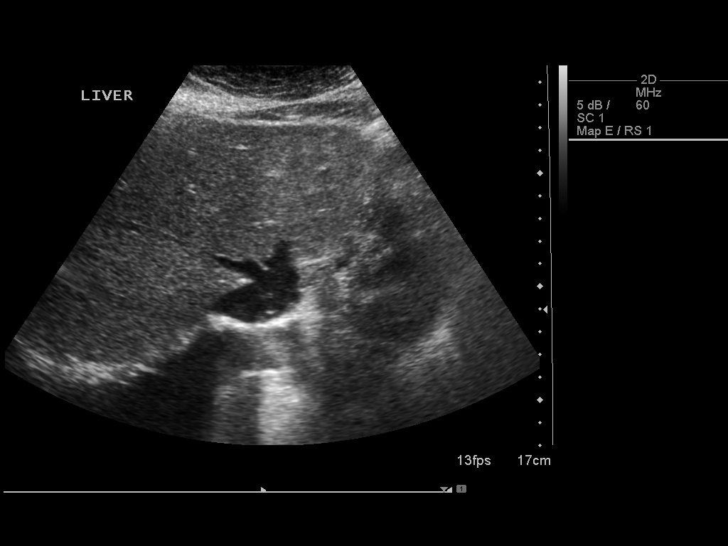
[im 38/82]
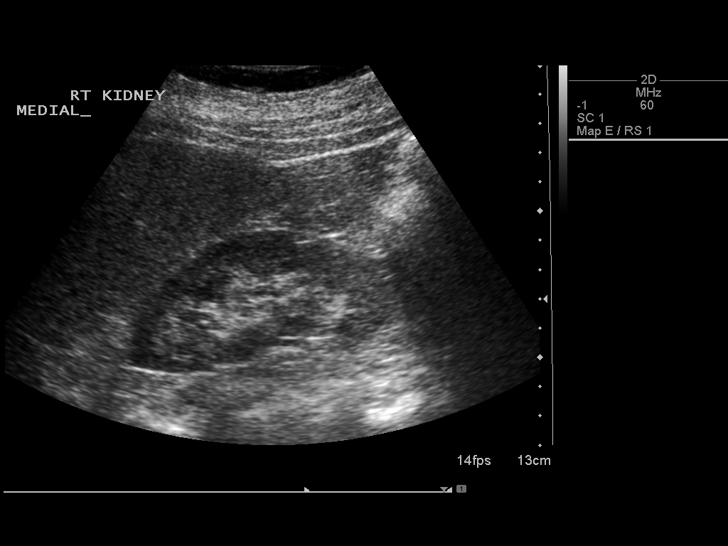
[im 44/82]
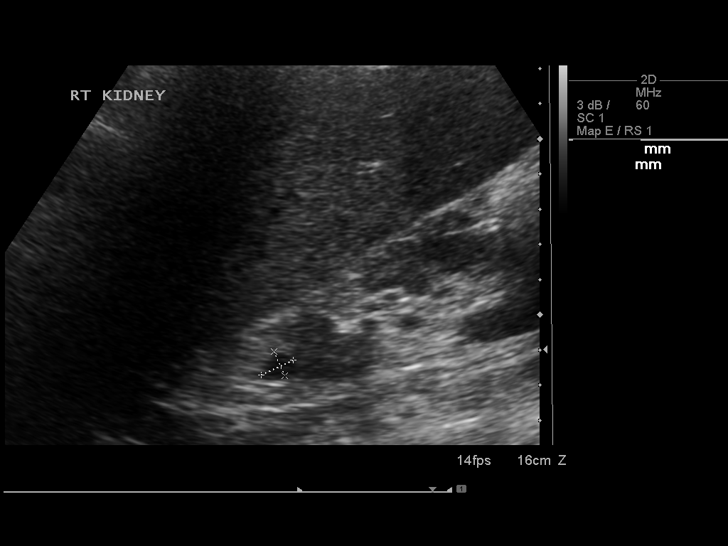
[im 51/82]
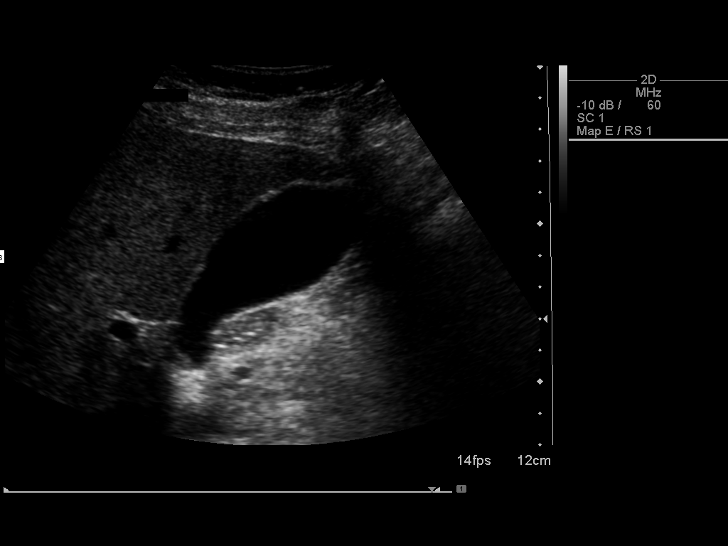
[im 55/82]
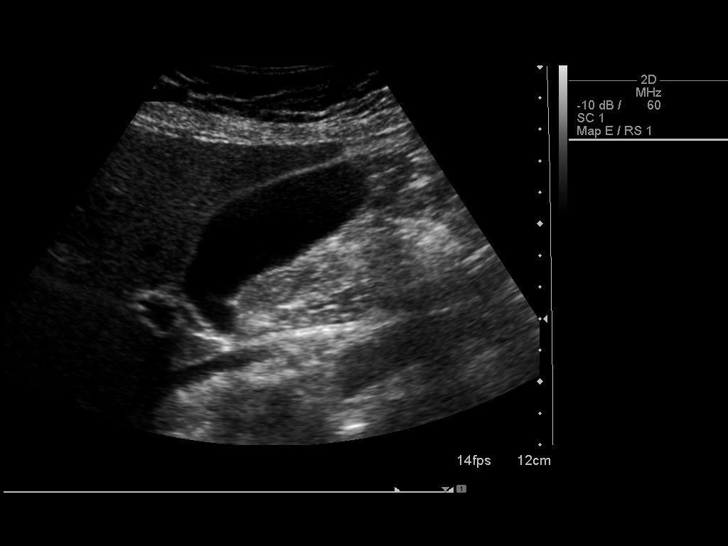
[im 61/82]
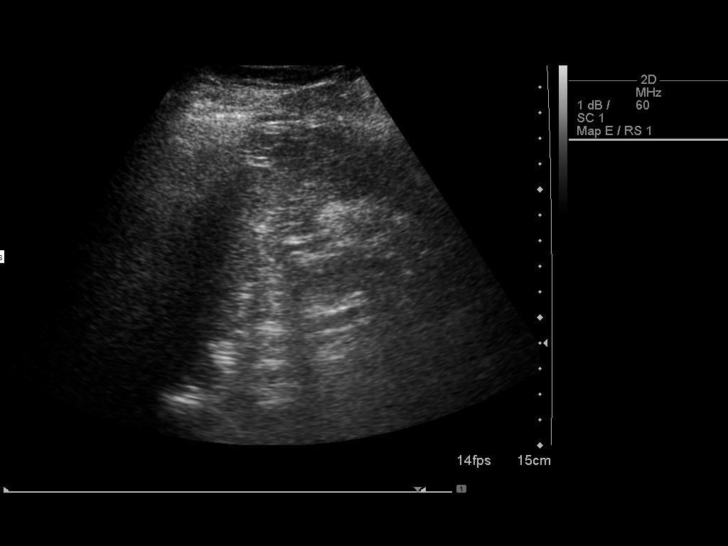
[im 68/82]
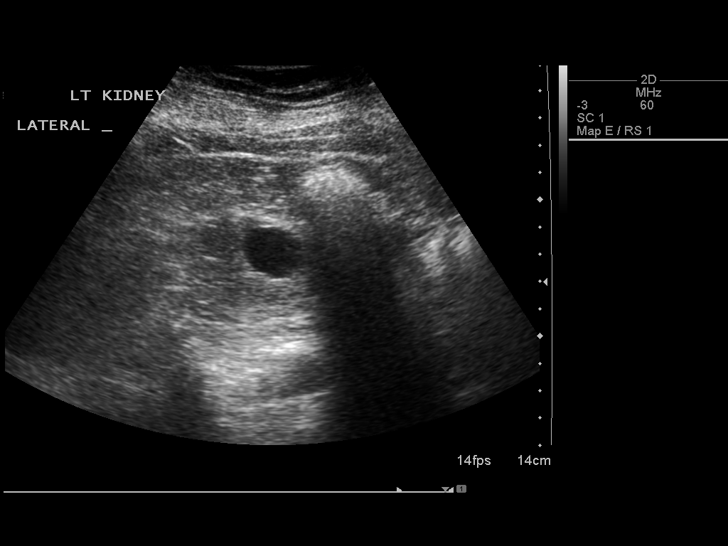
[im 75/82]
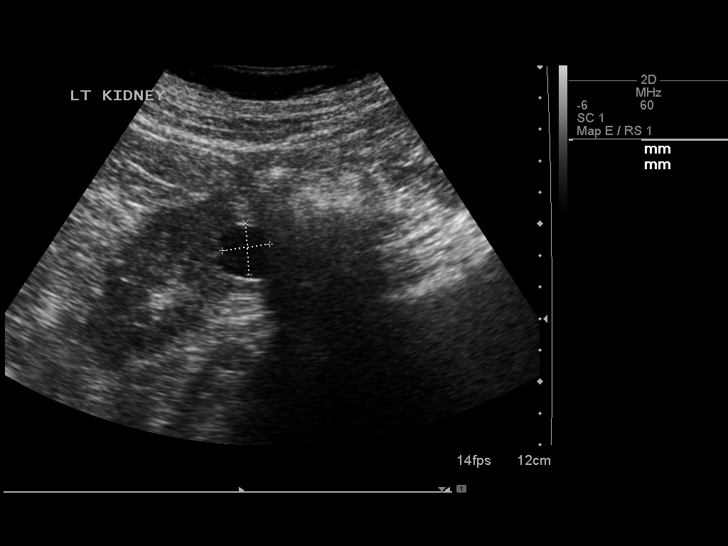
[im 82/82]
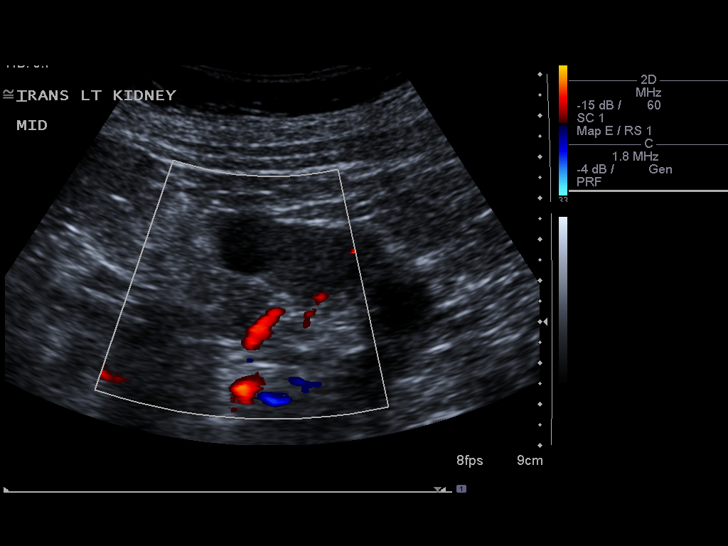

[14 of 25 positions shown; findings below may reference images not displayed]

FINDINGS: Gallbladder

No gallstones or wall thickening visualized. No sonographic Murphy
sign noted.

Common bile duct

Diameter: 2mm

Liver

No focal lesion identified. Within normal limits in parenchymal
echogenicity.

IVC

No abnormality visualized.

Pancreas

Visualized portion unremarkable.

Spleen

Size and appearance within normal limits.

Right Kidney

Length: 10.2 cm. No hydronephrosis or solid mass. Upper pole simple
appearing cyst measures 1.0 x 0.8 x 0.6 cm.

Left Kidney

Length: 9.1 cm. Simple appearing cysts measure 1.8 x 1.7 x 1.6 cm at
the midportion of the kidney and 1.9 x 1.7 x 1.5 cm at the lower
pole, respectively.

Abdominal aorta

No aneurysm visualized.
IMPRESSION: Simple appearing renal cysts incidentally noted. Normal exam
otherwise.

## 2015-12-31 ENCOUNTER — Other Ambulatory Visit: Payer: Self-pay | Admitting: Gastroenterology

## 2015-12-31 DIAGNOSIS — R1013 Epigastric pain: Secondary | ICD-10-CM

## 2016-01-06 ENCOUNTER — Ambulatory Visit
Admission: RE | Admit: 2016-01-06 | Discharge: 2016-01-06 | Disposition: A | Discharge status: Home / Self Care | Payer: Medicare Other | Source: Ambulatory Visit | Attending: Gastroenterology | Admitting: Gastroenterology

## 2016-01-06 DIAGNOSIS — R1013 Epigastric pain: Secondary | ICD-10-CM

## 2016-01-16 NOTE — H&P (Signed)
Cathy Page is an 66 y.o. female. Here for scheduled [facility] D&C and possibly polypectomy. She had PMB and underwent an [place] that showed a thickened EMS and possible polyp. She opted for D&C.  Pertinent Gynecological History: Postmenopausal bleeding G3P2 - CS x 3 H/o cyst removal via XL  Menstrual History: postmenopausal No LMP recorded.    Past Medical History:  Diagnosis Date  . Abdominal pain, left lower quadrant   . Anxiety   . Diabetes mellitus without complication   . Diverticulosis   . Elevated liver enzymes 11/30/2012  . Hx of leukocytosis 11/30/2012  . Hypercholesteremia   . Hyperlipidemia   . Hypertension   . Hypothyroidism   . Hypothyroidism   . Interstitial cystitis    Chronic, intermittent    Past Surgical History:  Procedure Laterality Date  . APPENDECTOMY    . CARDIAC CATHETERIZATION    . CESAREAN SECTION    . Laparatomy-Endometriosis      Family History  Problem Relation Age of Onset  . Diabetes Father   . CAD Mother     Social History:  reports that she has never smoked. She has never used smokeless tobacco. She reports that she drinks about 3.0 oz of alcohol per week . She reports that she does not use drugs.  Allergies:  Allergies  Allergen Reactions  . Sulfa Antibiotics Anaphylaxis  . Codeine Nausea And Vomiting  . Hydrocodone Nausea And Vomiting    No prescriptions prior to admission.    ROS WNL except HPI  There were no vitals taken for this visit. Physical Exam NAD CV reg rate PULM NWOB ABD soft, nontender, nondistended GYN atrophy noted No results found for this or any previous visit (from the past 24 hour(s)).  No results found.  Assessment/Plan: 66 yo w/PMB presenting for [facility], D&C, and possible polypectomy. [place] w/thickened EMS concerning for polyp. Cytotec preop. Risks discussed including infection, bleeding, damage to surrounding structures, fluid overload, and need for additional procedures. Agrees to proceed.   Tyson Dense 01/16/2016, 2:43 PM

## 2016-01-19 DIAGNOSIS — E119 Type 2 diabetes mellitus without complications: Secondary | ICD-10-CM | POA: Diagnosis not present

## 2016-01-19 DIAGNOSIS — Z136 Encounter for screening for cardiovascular disorders: Secondary | ICD-10-CM | POA: Diagnosis not present

## 2016-01-20 ENCOUNTER — Encounter (HOSPITAL_COMMUNITY): Payer: Self-pay | Admitting: Emergency Medicine

## 2016-01-20 ENCOUNTER — Emergency Department (HOSPITAL_COMMUNITY): Payer: PPO

## 2016-01-20 ENCOUNTER — Observation Stay (HOSPITAL_COMMUNITY)
Admission: EM | Admit: 2016-01-20 | Discharge: 2016-01-21 | Disposition: A | Discharge status: Home / Self Care | Payer: PPO | Attending: Internal Medicine | Admitting: Internal Medicine

## 2016-01-20 DIAGNOSIS — R0789 Other chest pain: Secondary | ICD-10-CM | POA: Diagnosis not present

## 2016-01-20 DIAGNOSIS — R079 Chest pain, unspecified: Principal | ICD-10-CM | POA: Diagnosis present

## 2016-01-20 DIAGNOSIS — I10 Essential (primary) hypertension: Secondary | ICD-10-CM | POA: Diagnosis not present

## 2016-01-20 DIAGNOSIS — F419 Anxiety disorder, unspecified: Secondary | ICD-10-CM | POA: Insufficient documentation

## 2016-01-20 DIAGNOSIS — E119 Type 2 diabetes mellitus without complications: Secondary | ICD-10-CM | POA: Diagnosis not present

## 2016-01-20 DIAGNOSIS — Z7982 Long term (current) use of aspirin: Secondary | ICD-10-CM | POA: Insufficient documentation

## 2016-01-20 DIAGNOSIS — R000 Tachycardia, unspecified: Secondary | ICD-10-CM | POA: Insufficient documentation

## 2016-01-20 DIAGNOSIS — Z8249 Family history of ischemic heart disease and other diseases of the circulatory system: Secondary | ICD-10-CM | POA: Insufficient documentation

## 2016-01-20 DIAGNOSIS — Z79899 Other long term (current) drug therapy: Secondary | ICD-10-CM | POA: Insufficient documentation

## 2016-01-20 DIAGNOSIS — Z7984 Long term (current) use of oral hypoglycemic drugs: Secondary | ICD-10-CM | POA: Insufficient documentation

## 2016-01-20 DIAGNOSIS — E039 Hypothyroidism, unspecified: Secondary | ICD-10-CM | POA: Insufficient documentation

## 2016-01-20 DIAGNOSIS — R1013 Epigastric pain: Secondary | ICD-10-CM | POA: Diagnosis not present

## 2016-01-20 LAB — BASIC METABOLIC PANEL
ANION GAP: 10 (ref 5–15)
BUN: 17 mg/dL (ref 6–20)
CHLORIDE: 103 mmol/L (ref 101–111)
CO2: 24 mmol/L (ref 22–32)
Calcium: 9.4 mg/dL (ref 8.9–10.3)
Creatinine, Ser: 0.86 mg/dL (ref 0.44–1.00)
Glucose, Bld: 102 mg/dL — ABNORMAL HIGH (ref 65–99)
Potassium: 4.1 mmol/L (ref 3.5–5.1)
Sodium: 137 mmol/L (ref 135–145)

## 2016-01-20 LAB — D-DIMER, QUANTITATIVE: D-Dimer, Quant: 0.29 ug/mL-FEU (ref 0.00–0.50)

## 2016-01-20 LAB — CBC
HCT: 39.7 % (ref 36.0–46.0)
Hemoglobin: 13.6 g/dL (ref 12.0–15.0)
MCH: 30.8 pg (ref 26.0–34.0)
MCHC: 34.3 g/dL (ref 30.0–36.0)
MCV: 90 fL (ref 78.0–100.0)
Platelets: 250 10*3/uL (ref 150–400)
RBC: 4.41 MIL/uL (ref 3.87–5.11)
RDW: 13.1 % (ref 11.5–15.5)
WBC: 6.2 10*3/uL (ref 4.0–10.5)

## 2016-01-20 LAB — I-STAT TROPONIN, ED: Troponin i, poc: 0 ng/mL (ref 0.00–0.08)

## 2016-01-20 MED ORDER — THYROID 60 MG PO TABS
90.0000 mg | ORAL_TABLET | Freq: Every day | ORAL | Status: DC
  Filled 2016-01-20: qty 1

## 2016-01-20 MED ORDER — ALPRAZOLAM 0.25 MG PO TABS
0.5000 mg | ORAL_TABLET | Freq: Two times a day (BID) | ORAL | Status: DC | PRN
Start: 2016-01-20 — End: 2016-01-21

## 2016-01-20 MED ORDER — ONDANSETRON HCL 4 MG/2ML IJ SOLN: 4.0000 mg | Freq: Four times a day (QID) | INTRAMUSCULAR | Status: DC | PRN

## 2016-01-20 MED ORDER — ACETAMINOPHEN 325 MG PO TABS: 650.0000 mg | ORAL_TABLET | ORAL | Status: DC | PRN

## 2016-01-20 MED ORDER — LISINOPRIL 10 MG PO TABS
10.0000 mg | ORAL_TABLET | Freq: Every morning | ORAL | Status: DC
  Administered 2016-01-21: 10 mg via ORAL
  Filled 2016-01-20: qty 1

## 2016-01-20 MED ORDER — ENOXAPARIN SODIUM 40 MG/0.4ML ~~LOC~~ SOLN
40.0000 mg | SUBCUTANEOUS | Status: DC
  Administered 2016-01-21: 40 mg via SUBCUTANEOUS
  Filled 2016-01-20: qty 0.4

## 2016-01-20 MED ORDER — LIOTHYRONINE SODIUM 5 MCG PO TABS
5.0000 ug | ORAL_TABLET | Freq: Every day | ORAL | Status: DC
  Filled 2016-01-20: qty 1

## 2016-01-20 MED ORDER — LEVOTHYROXINE SODIUM 100 MCG PO TABS
100.0000 ug | ORAL_TABLET | Freq: Every day | ORAL | Status: DC
  Filled 2016-01-20: qty 1

## 2016-01-20 MED ORDER — ASPIRIN 325 MG PO TABS
325.0000 mg | ORAL_TABLET | Freq: Every day | ORAL | Status: DC
  Administered 2016-01-20 – 2016-01-21 (×2): 325 mg via ORAL
  Filled 2016-01-20 (×2): qty 1

## 2016-01-20 NOTE — H&P (Signed)
History and Physical    Cathy Page [id number] DOB: 08/27/1950 DOA: 01/20/2016   PCP: Rachell Cipro, MD Chief Complaint:  Chief Complaint  Patient presents with  . Chest Pain    HPI: Cathy Page is a 66 y.o. female with medical history significant of HTN, HLD, ? DM (was on metformin but then "sugar got better" so off of metformin).  Last stress test was in 2014, WNL at that time.  Patient presents to the ED with c/o chest pain.  She was working out today (first time in a long time), walking on incline.  Developed L sided CP with burning in L shoulder.  Pain decreased and resolved without intervention.  ED Course: Trop neg, EKG just shows mild S tach at 104.  Review of Systems: As per HPI otherwise 10 point review of systems negative.    Past Medical History:  Diagnosis Date  . Abdominal pain, left lower quadrant   . Anxiety   . Diabetes mellitus without complication ([place])   . Diverticulosis   . Elevated liver enzymes 11/30/2012  . Hx of leukocytosis 11/30/2012  . Hypercholesteremia   . Hyperlipidemia   . Hypertension   . Hypothyroidism   . Hypothyroidism   . Interstitial cystitis    Chronic, intermittent    Past Surgical History:  Procedure Laterality Date  . APPENDECTOMY    . CARDIAC CATHETERIZATION    . CESAREAN SECTION    . Laparatomy-Endometriosis       reports that she has never smoked. She has never used smokeless tobacco. She reports that she drinks about 3.0 oz of alcohol per week . She reports that she does not use drugs.  Allergies  Allergen Reactions  . Sulfa Antibiotics Anaphylaxis  . Codeine Nausea And Vomiting  . Hydrocodone Nausea And Vomiting    Family History  Problem Relation Age of Onset  . Diabetes Father   . CAD Mother       Prior to Admission medications   Medication Sig Start Date End Date Taking? Authorizing Provider  ALPRAZolam Duanne Moron) 1 MG tablet Take 0.5 mg by mouth 2 (two) times daily as needed for anxiety.    Yes Historical Provider, MD  aspirin 81 MG chewable tablet Chew 81 mg by mouth daily.   Yes Historical Provider, MD  Cholecalciferol (VITAMIN D-3) 1000 UNITS CAPS Take 3,000 Units by mouth daily.   Yes Historical Provider, MD  Coenzyme Q10 (CO Q 10 PO) Take 1 tablet by mouth daily.   Yes Historical Provider, MD  diphenhydrAMINE (BENADRYL) 25 mg capsule Take 25 mg by mouth daily as needed for allergies.   Yes Historical Provider, MD  Estradiol 0.5 MG/0.5GM GEL Place onto the skin.   Yes Historical Provider, MD  fish oil-omega-3 fatty acids 1000 MG capsule Take 2 g by mouth daily.   Yes Historical Provider, MD  levothyroxine (SYNTHROID, LEVOTHROID) 100 MCG tablet Take 100 mcg by mouth daily before breakfast.   Yes Historical Provider, MD  liothyronine (CYTOMEL) 5 MCG tablet Take 5 mcg by mouth daily.   Yes Historical Provider, MD  lisinopril (PRINIVIL,ZESTRIL) 20 MG tablet Take 10 mg by mouth every morning.   Yes Historical Provider, MD  Multiple Vitamins-Minerals (MULTIVITAMIN PO) Take 1 tablet by mouth daily.   Yes Historical Provider, MD  thyroid (ARMOUR) 90 MG tablet Take 90 mg by mouth daily.   Yes Historical Provider, MD  vitamin C (ASCORBIC ACID) 500 MG tablet Take 500 mg by mouth daily.   Yes  Historical Provider, MD    Physical Exam: Vitals:   01/20/16 2000 01/20/16 2300  BP: 139/85 108/67  Pulse: 103 72  Resp: 22 16  Temp: 98.1 F (36.7 C)   TempSrc: Oral   SpO2: 100% 100%  Weight: 72.1 kg (159 lb)   Height: 5' 4.5" (1.638 m)       Constitutional: NAD, calm, comfortable Eyes: PERRL, lids and conjunctivae normal ENMT: Mucous membranes are moist. Posterior pharynx clear of any exudate or lesions.Normal dentition.  Neck: normal, supple, no masses, no thyromegaly Respiratory: clear to auscultation bilaterally, no wheezing, no crackles. Normal respiratory effort. No accessory muscle use.  Cardiovascular: Regular rate and rhythm, no murmurs / rubs / gallops. No extremity edema.  2+ pedal pulses. No carotid bruits.  Abdomen: no tenderness, no masses palpated. No hepatosplenomegaly. Bowel sounds positive.  Musculoskeletal: no clubbing / cyanosis. No joint deformity upper and lower extremities. Good ROM, no contractures. Normal muscle tone.  Skin: no rashes, lesions, ulcers. No induration Neurologic: CN 2-12 grossly intact. Sensation intact, DTR normal. Strength 5/5 in all 4.  Psychiatric: Normal judgment and insight. Alert and oriented x 3. Normal mood.    Labs on Admission: I have personally reviewed following labs and imaging studies  CBC:  Recent Labs Lab 01/20/16 2006  WBC 6.2  HGB 13.6  HCT 39.7  MCV 90.0  PLT [id number]   Basic Metabolic Panel:  Recent Labs Lab 01/20/16 2006  NA 137  K 4.1  CL 103  CO2 24  GLUCOSE 102*  BUN 17  CREATININE 0.86  CALCIUM 9.4   GFR: Estimated Creatinine Clearance: 62.5 mL/min (by C-G formula based on SCr of 0.86 mg/dL). Liver Function Tests: No results for input(s): AST, ALT, ALKPHOS, BILITOT, PROT, ALBUMIN in the last 168 hours. No results for input(s): LIPASE, AMYLASE in the last 168 hours. No results for input(s): AMMONIA in the last 168 hours. Coagulation Profile: No results for input(s): INR, PROTIME in the last 168 hours. Cardiac Enzymes: No results for input(s): CKTOTAL, CKMB, CKMBINDEX, TROPONINI in the last 168 hours. BNP (last 3 results) No results for input(s): PROBNP in the last 8760 hours. HbA1C: No results for input(s): HGBA1C in the last 72 hours. CBG: No results for input(s): GLUCAP in the last 168 hours. Lipid Profile: No results for input(s): CHOL, HDL, LDLCALC, TRIG, CHOLHDL, LDLDIRECT in the last 72 hours. Thyroid Function Tests: No results for input(s): TSH, T4TOTAL, FREET4, T3FREE, THYROIDAB in the last 72 hours. Anemia Panel: No results for input(s): VITAMINB12, FOLATE, FERRITIN, TIBC, IRON, RETICCTPCT in the last 72 hours. Urine analysis: No results found for: COLORURINE,  APPEARANCEUR, LABSPEC, PHURINE, GLUCOSEU, HGBUR, BILIRUBINUR, KETONESUR, PROTEINUR, UROBILINOGEN, NITRITE, LEUKOCYTESUR Sepsis Labs: @LABRCNTIP (procalcitonin:4,lacticidven:4) )No results found for this or any previous visit (from the past 240 hour(s)).   Radiological Exams on Admission: Dg Chest 2 View  Result Date: 01/20/2016 CLINICAL DATA:  66 y/o  F; epigastric pain and pressure. EXAM: CHEST  2 VIEW COMPARISON:  10/27/2012 chest radiograph FINDINGS: The heart size and mediastinal contours are within normal limits and stable. Both lungs are clear. The visualized skeletal structures are unremarkable. IMPRESSION: No active cardiopulmonary disease. Electronically Signed   By: Kristine Garbe M.D.   On: 01/20/2016 21:01    EKG: Independently reviewed.  Assessment/Plan Active Problems:   Chest pain, rule out acute myocardial infarction    1. CP r/o -  1. Cp obs pathway 2. NPO after midnight 3. Serial trops 4. HEART score of 5 5. Tele  monitor 6. May need stress test in AM if trops neg.   DVT prophylaxis: Lovenox Code Status: Full Family Communication: Family at bedside Consults called: None Admission status: Place in [place], [place] [facility] Pager [phone] from 7PM-7AM  If 7AM-7PM, please contact the day physician for the patient [email or web address] Password [email or web address]  01/20/2016, 11:42 PM

## 2016-01-20 NOTE — ED Notes (Signed)
Pt reports she has a hx of anxiety and has been under a great deal of stress for "the past year"

## 2016-01-20 NOTE — ED Triage Notes (Signed)
Pt came into the ED w/ "crushing chest pain" which "got real bad after I worked out."  The pain has been going on for a few day but not to this extent.  Reports "a little" dizziness and nausea.

## 2016-01-20 NOTE — ED Provider Notes (Addendum)
[facility] DEPT Provider Note   CSN: [id number] Arrival date & time: 01/20/16  1956     History   Chief Complaint Chief Complaint  Patient presents with  . Chest Pain    HPI Cathy Page is a 66 y.o. female.With a past medical history of anxiety, interstitial cystitis, diabetes, hyperlipidemia and hypertension who presents to the emergency with chief complaint of chest pain. The patient states that she was working out today walking on an incline when she developed left-sided chest pain. She stopped working out and about 5 minutes after she began having crushing left-sided chest pain with burning in the left shoulder. She states that the pain has decreased without intervention. Since she arrived in the emergency department, but rates it as about a 3 out of 10. She is at that time it was 8-10 out of 10. She denies any shortness of breath, nausea, diaphoresis. The patient had a normal stress test in January 2014. Patient states that she has had some intermittent chest pain on the right side over the past several weeks and some retrosternal chest pain that did not seem to be exertional or cause dyspnea. She also denies any history of reflux. Patient has a family history of coronary artery disease, but no early MIs. She denies risk factors for pulmonary embolus such as unilateral leg swelling, cancer, history of DVT or PE, recent confinement, surgery or injuries, over the patient does use exogenous estrogen.  HPI  Past Medical History:  Diagnosis Date  . Abdominal pain, left lower quadrant   . Anxiety   . Diabetes mellitus without complication ([place])   . Diverticulosis   . Elevated liver enzymes 11/30/2012  . Hx of leukocytosis 11/30/2012  . Hypercholesteremia   . Hyperlipidemia   . Hypertension   . Hypothyroidism   . Hypothyroidism   . Interstitial cystitis    Chronic, intermittent    Patient Active Problem List   Diagnosis Date Noted  . Hx of leukocytosis 11/30/2012  .  Elevated liver enzymes 11/30/2012  . Chest pain 11/23/2012  . Palpitation 11/23/2012  . Hyperlipidemia   . Hypertension   . Diabetes mellitus without complication [facility])     Past Surgical History:  Procedure Laterality Date  . APPENDECTOMY    . CARDIAC CATHETERIZATION    . CESAREAN SECTION    . Laparatomy-Endometriosis      OB History    No data available       Home Medications    Prior to Admission medications   Medication Sig Start Date End Date Taking? Authorizing Provider  ALPRAZolam Duanne Moron) 1 MG tablet Take 0.5 mg by mouth 2 (two) times daily as needed for anxiety.    Historical Provider, MD  aspirin 81 MG chewable tablet Chew 81 mg by mouth daily.    Historical Provider, MD  Cholecalciferol (VITAMIN D-3) 1000 UNITS CAPS Take 3,000 Units by mouth daily.    Historical Provider, MD  Coenzyme Q10 (CO Q 10 PO) Take 1 tablet by mouth daily.    Historical Provider, MD  diphenhydrAMINE (BENADRYL) 25 mg capsule Take 25 mg by mouth daily as needed for allergies.    Historical Provider, MD  Estradiol 0.5 MG/0.5GM GEL Place onto the skin.    Historical Provider, MD  fish oil-omega-3 fatty acids 1000 MG capsule Take 2 g by mouth daily.    Historical Provider, MD  liothyronine (CYTOMEL) 5 MCG tablet Take 5 mcg by mouth daily.    Historical Provider, MD  lisinopril (  PRINIVIL,ZESTRIL) 20 MG tablet Take 10 mg by mouth every morning.    Historical Provider, MD  metFORMIN (GLUCOPHAGE) 500 MG tablet Take 500 mg by mouth 2 (two) times daily with a meal.    Historical Provider, MD  Multiple Vitamins-Minerals (MULTIVITAMIN PO) Take 1 tablet by mouth daily.    Historical Provider, MD  omeprazole (PRILOSEC) 20 MG capsule Take 1 capsule (20 mg total) by mouth daily. 10/27/12   Jola Schmidt, MD  progesterone (PROMETRIUM) 100 MG capsule Take 100 mg by mouth daily.    Historical Provider, MD  Red Yeast Rice 600 MG CAPS Take 2 capsules by mouth.    Historical Provider, MD  thyroid (ARMOUR) 90 MG  tablet Take 90 mg by mouth daily.    Historical Provider, MD  vitamin C (ASCORBIC ACID) 500 MG tablet Take 500 mg by mouth daily.    Historical Provider, MD    Family History Family History  Problem Relation Age of Onset  . Diabetes Father   . CAD Mother     Social History Social History  Substance Use Topics  . Smoking status: Never Smoker  . Smokeless tobacco: Never Used  . Alcohol use 3.0 oz/week    5 Glasses of wine per week     Allergies   Sulfa antibiotics; Codeine; and Hydrocodone   Review of Systems Review of Systems  Ten systems reviewed and are negative for acute change, except as noted in the HPI.   Physical Exam Updated Vital Signs BP 139/85 (BP Location: Left Arm)   Pulse 103   Temp 98.1 F (36.7 C) (Oral)   Resp 22   Ht 5' 4.5" (1.638 m)   Wt 72.1 kg   SpO2 100%   BMI 26.87 kg/m   Physical Exam  Constitutional: She is oriented to person, place, and time. She appears well-developed and well-nourished. No distress.  HENT:  Head: Normocephalic and atraumatic.  Eyes: Conjunctivae are normal. No scleral icterus.  Neck: Normal range of motion.  Cardiovascular: Normal rate, regular rhythm and normal heart sounds.  Exam reveals no gallop and no friction rub.   No murmur heard. Pulmonary/Chest: Effort normal and breath sounds normal. No respiratory distress.  Abdominal: Soft. Bowel sounds are normal. She exhibits no distension and no mass. There is no tenderness. There is no guarding.  Neurological: She is alert and oriented to person, place, and time.  Skin: Skin is warm and dry. She is not diaphoretic.  Psychiatric:  Very anxious     ED Treatments / Results  Labs (all labs ordered are listed, but only abnormal results are displayed) Labs Reviewed  [place], ED    EKG  EKG Interpretation  Date/Time:  Tuesday January 20 2016 19:57:47 EST Ventricular Rate:  104 PR Interval:    QRS Duration: 87 QT  Interval:  333 QTC Calculation: 438 R Axis:   79 Text Interpretation:  Sinus tachycardia agree. no change from previous Confirmed by Johnney Killian, MD, Jeannie Done [phone]) on 01/20/2016 8:51:07 PM       Radiology No results found.  Procedures Procedures (including critical care time)  Medications Ordered in ED Medications - No data to display   Initial Impression / Assessment and Plan / ED Course  I have reviewed the triage vital signs and the nursing notes.  Pertinent labs & imaging results that were available during my care of the patient were reviewed by me and considered in my medical decision making (see chart  for details).  Clinical Course     Patient with symptoms concerning for potential ACS. She has a heart score of 6. Placing her in the moderate risk for adverse cardiac events. Patient also noted be tachycardic on EKG. I have ordered a d-dimer. I feel she will need to come in for ACS rule out. Given the patient by mouth aspirin. Patient will be admitted for cardiac rule out.  Final Clinical Impressions(s) / ED Diagnoses   Final diagnoses:  Chest pain with moderate risk for cardiac etiology    New Prescriptions New Prescriptions   No medications on file     Margarita Mail, PA-C 01/21/16 0101    Charlesetta Shanks, MD 01/22/16 [place], PA-C 02/11/16 [place], MD 03/03/16 [phone]

## 2016-01-20 NOTE — ED Notes (Signed)
Patient transported to X-ray 

## 2016-01-21 ENCOUNTER — Observation Stay (HOSPITAL_BASED_OUTPATIENT_CLINIC_OR_DEPARTMENT_OTHER): Payer: PPO

## 2016-01-21 DIAGNOSIS — R079 Chest pain, unspecified: Secondary | ICD-10-CM

## 2016-01-21 HISTORY — PX: CARDIOVASCULAR STRESS TEST: SHX262

## 2016-01-21 LAB — NM MYOCAR MULTI W/SPECT W/WALL MOTION / EF
CHL CUP MPHR: 153 {beats}/min
CHL CUP NUCLEAR SSS: 9
CHL CUP RESTING HR STRESS: 83 {beats}/min
CSEPEDS: 36 s
CSEPEW: 1 METS
Exercise duration (min): 10 min
LV sys vol: 13 mL
LVDIAVOL: 50 mL (ref 46–106)
NUC STRESS TID: 0.92
Peak HR: 131 {beats}/min
Percent HR: 85 %
RATE: 0.37
SDS: 0
SRS: 9

## 2016-01-21 LAB — TROPONIN I: Troponin I: <0.03 ng/mL (ref <0.03)

## 2016-01-21 MED ORDER — TECHNETIUM TC 99M TETROFOSMIN IV KIT
30.0000 | PACK | Freq: Once | INTRAVENOUS | Status: AC | PRN
  Administered 2016-01-21: 30 via INTRAVENOUS

## 2016-01-21 MED ORDER — REGADENOSON 0.4 MG/5ML IV SOLN
INTRAVENOUS | Status: AC
  Administered 2016-01-21: 0.4 mg via INTRAVENOUS
  Filled 2016-01-21: qty 5

## 2016-01-21 MED ORDER — TECHNETIUM TC 99M TETROFOSMIN IV KIT
10.0000 | PACK | Freq: Once | INTRAVENOUS | Status: AC | PRN
  Administered 2016-01-21: 10 via INTRAVENOUS

## 2016-01-21 MED ORDER — LORAZEPAM 2 MG/ML IJ SOLN
0.5000 mg | Freq: Once | INTRAMUSCULAR | Status: AC
  Administered 2016-01-21: 0.5 mg via INTRAVENOUS

## 2016-01-21 MED ORDER — REGADENOSON 0.4 MG/5ML IV SOLN
0.4000 mg | Freq: Once | INTRAVENOUS | Status: AC
  Administered 2016-01-21: 0.4 mg via INTRAVENOUS
  Filled 2016-01-21: qty 5

## 2016-01-21 MED ORDER — LORAZEPAM 2 MG/ML IJ SOLN
INTRAMUSCULAR | Status: AC
  Administered 2016-01-21: 0.5 mg via INTRAVENOUS
  Filled 2016-01-21: qty 1

## 2016-01-21 NOTE — Progress Notes (Signed)
Nuclear stress test ordered. Pt has had some recurrent pain, but ez and ECG are not acute.  Lexiscan MV performed. 1 day study, [facility] to read. Pt developed CP during the test, was offered NTG but declined due to HA possibility. Pt very anxious, will try Ativan.  Rosaria Ferries, PA-C 01/21/2016 10:04 AM Beeper [phone]

## 2016-01-21 NOTE — Discharge Summary (Signed)
Physician Discharge Summary  Cathy Page [id number] DOB: February 26, 1950 DOA: 01/20/2016  PCP: Rachell Cipro, MD  Admit date: 01/20/2016 Discharge date: 01/21/2016   Recommendations for Outpatient Follow-Up:     Discharge Diagnosis:   Active Problems:   Chest pain, rule out acute myocardial infarction   Discharge disposition:  Home.    Discharge Condition: Improved.  Diet recommendation: Low sodium, heart healthy.  Wound care: None.   History of Present Illness:   Cathy Page is a 66 y.o. female with medical history significant of HTN, HLD, ? DM (was on metformin but then "sugar got better" so off of metformin).  Last stress test was in 2014, WNL at that time.  Patient presents to the ED with c/o chest pain.  She was working out today (first time in a long time), walking on incline.  Developed L sided CP with burning in L shoulder.  Pain decreased and resolved without intervention   Hospital Course by Problem:   Chest pain-- CE negative Nuclear stress test low risk    Medical Consultants:    None.   Discharge Exam:   Vitals:   01/21/16 1151 01/21/16 1316  BP: 103/64 111/70  Pulse:  87  Resp:  18  Temp:  98 F (36.7 C)   Vitals:   01/21/16 1000 01/21/16 1005 01/21/16 1151 01/21/16 1316  BP: (!) 147/89 139/82 103/64 111/70  Pulse: 100 (!) 104  87  Resp:    18  Temp:    98 F (36.7 C)  TempSrc:    Oral  SpO2:    100%  Weight:      Height:        Gen:  NAD    The results of significant diagnostics from this hospitalization (including imaging, microbiology, ancillary and laboratory) are listed below for reference.     Procedures and Diagnostic Studies:   Dg Chest 2 View  Result Date: 01/20/2016 CLINICAL DATA:  66 y/o  F; epigastric pain and pressure. EXAM: CHEST  2 VIEW COMPARISON:  10/27/2012 chest radiograph FINDINGS: The heart size and mediastinal contours are within normal limits and stable. Both lungs are clear. The visualized  skeletal structures are unremarkable. IMPRESSION: No active cardiopulmonary disease. Electronically Signed   By: Kristine Garbe M.D.   On: 01/20/2016 21:01   Nm Myocar Multi W/spect W/wall Motion / Ef  Result Date: 01/21/2016  There was no ST segment deviation noted during stress.  The study is normal. There is no ischemia . No evidence of previous MI  This is a low risk study.  The left ventricular ejection fraction is hyperdynamic (>65%).  Nuclear stress EF: 74%.      Labs:   Basic Metabolic Panel:  Recent Labs Lab 01/20/16 2006  NA 137  K 4.1  CL 103  CO2 24  GLUCOSE 102*  BUN 17  CREATININE 0.86  CALCIUM 9.4   GFR Estimated Creatinine Clearance: 62.2 mL/min (by C-G formula based on SCr of 0.86 mg/dL). Liver Function Tests: No results for input(s): AST, ALT, ALKPHOS, BILITOT, PROT, ALBUMIN in the last 168 hours. No results for input(s): LIPASE, AMYLASE in the last 168 hours. No results for input(s): AMMONIA in the last 168 hours. Coagulation profile No results for input(s): INR, PROTIME in the last 168 hours.  CBC:  Recent Labs Lab 01/20/16 2006  WBC 6.2  HGB 13.6  HCT 39.7  MCV 90.0  PLT 250   Cardiac Enzymes:  Recent Labs Lab 01/20/16 2330 01/21/16  0303 01/21/16 0556  TROPONINI <0.03 <0.03 <0.03   BNP: Invalid input(s): POCBNP CBG: No results for input(s): GLUCAP in the last 168 hours. D-Dimer  Recent Labs  01/20/16 2020  DDIMER 0.29   Hgb A1c No results for input(s): HGBA1C in the last 72 hours. Lipid Profile No results for input(s): CHOL, HDL, LDLCALC, TRIG, CHOLHDL, LDLDIRECT in the last 72 hours. Thyroid function studies No results for input(s): TSH, T4TOTAL, T3FREE, THYROIDAB in the last 72 hours.  Invalid input(s): FREET3 Anemia work up No results for input(s): VITAMINB12, FOLATE, FERRITIN, TIBC, IRON, RETICCTPCT in the last 72 hours. Microbiology No results found for this or any previous visit (from the past 240  hour(s)).   Discharge Instructions:   Discharge Instructions    Diet - low sodium heart healthy    Complete by:  As directed    Increase activity slowly    Complete by:  As directed      Allergies as of 01/21/2016      Reactions   Sulfa Antibiotics Anaphylaxis   Codeine Nausea And Vomiting   Hydrocodone Nausea And Vomiting      Medication List    TAKE these medications   ALPRAZolam 1 MG tablet Commonly known as:  XANAX Take 0.5 mg by mouth 2 (two) times daily as needed for anxiety.   aspirin 81 MG chewable tablet Chew 81 mg by mouth daily.   CO Q 10 PO Take 1 tablet by mouth daily.   diphenhydrAMINE 25 mg capsule Commonly known as:  BENADRYL Take 25 mg by mouth daily as needed for allergies.   Estradiol 0.5 MG/0.5GM Gel Place onto the skin.   fish oil-omega-3 fatty acids 1000 MG capsule Take 2 g by mouth daily.   levothyroxine 100 MCG tablet Commonly known as:  SYNTHROID, LEVOTHROID Take 100 mcg by mouth daily before breakfast.   liothyronine 5 MCG tablet Commonly known as:  CYTOMEL Take 5 mcg by mouth daily.   lisinopril 20 MG tablet Commonly known as:  PRINIVIL,ZESTRIL Take 10 mg by mouth every morning.   MULTIVITAMIN PO Take 1 tablet by mouth daily.   thyroid 90 MG tablet Commonly known as:  ARMOUR Take 90 mg by mouth daily.   vitamin C 500 MG tablet Commonly known as:  ASCORBIC ACID Take 500 mg by mouth daily.   Vitamin D-3 1000 units Caps Take 3,000 Units by mouth daily.      Follow-up Information    DEWEY,ELIZABETH, MD Follow up in 1 week(s).   Specialty:  Family Medicine Contact information: [address] [place] [address] [phone]            Time coordinating discharge: 35 min  Signed:  Quint Chestnut U Eugen Jeansonne   [facility] 01/21/2016, 3:25 PM

## 2016-01-21 NOTE — ED Notes (Signed)
Pt reports no chest pain at this time, admits to being anxious but no chest pain for about an hour.

## 2016-01-21 NOTE — Care Management Note (Signed)
Case Management Note  Patient Details  Name: Cathy Page MRN: [id number] Date of Birth: [birth year]  Subjective/Objective:    Chest pain                Action/Plan: Discharge Planning: NCM spoke to pt and husband, Jeneen Rinks # [phone] at bedside. Pt states she was independent prior to hospital stay. Waiting results of Lexiscan. Will continue to follow for dc needs.   PCP  Fanny Bien MD  Expected Discharge Date:                Expected Discharge Plan:  Home/Self Care  In-House Referral:  NA  Discharge planning Services  CM Consult  Post Acute Care Choice:  NA Choice offered to:  NA  DME Arranged:  N/A DME Agency:  NA  HH Arranged:  NA HH Agency:  NA  Status of Service:  Completed, signed off  If discussed at [place] of Stay Meetings, dates discussed:    Additional Comments:  Erenest Rasher, RN 01/21/2016, 2:46 PM

## 2016-01-21 NOTE — Care Management Obs Status (Signed)
[place] NOTIFICATION   Patient Details  Name: Cathy Page MRN: [id number] Date of Birth: [birth year]   Medicare Observation Status Notification Given:  Yes    Erenest Rasher, RN 01/21/2016, 2:44 PM

## 2016-01-21 NOTE — Progress Notes (Signed)
01/21/2016 6:12 PM Discharge AVS meds taken today and those due this evening reviewed.  Follow-up appointments and when to call md reviewed.  D/C IV and TELE.  Questions and concerns addressed.   D/C home per orders. [place]

## 2016-01-22 ENCOUNTER — Encounter (HOSPITAL_BASED_OUTPATIENT_CLINIC_OR_DEPARTMENT_OTHER): Payer: Self-pay | Admitting: *Deleted

## 2016-01-22 NOTE — Progress Notes (Signed)
NPO AFTER MN.  ARRIVE AT 0600  CURRENT LAB RESULTS AND EKG IN CHART AND EPIC.  WILL TAKE SYNTHROID AND CYTOMEL AM DOS W/ SIPS OF WATER.

## 2016-01-26 NOTE — Progress Notes (Signed)
Received call from Cathy Page ,[facility] scheduler for Dr. Alfred Levins. Mary questions if cardiac clearance needed because pt had c/o chest pain on the 9th and was admitted for obsevation.  Stress test wnl. Chart reviewed by Dr. Isaiah Serge.  Ok to proceed no further clearance needed.  Mary called and informed.

## 2016-01-27 ENCOUNTER — Ambulatory Visit (HOSPITAL_BASED_OUTPATIENT_CLINIC_OR_DEPARTMENT_OTHER): Payer: PPO | Admitting: Anesthesiology

## 2016-01-27 ENCOUNTER — Encounter (HOSPITAL_BASED_OUTPATIENT_CLINIC_OR_DEPARTMENT_OTHER): Payer: Self-pay | Admitting: *Deleted

## 2016-01-27 ENCOUNTER — Ambulatory Visit (HOSPITAL_BASED_OUTPATIENT_CLINIC_OR_DEPARTMENT_OTHER)
Admission: RE | Admit: 2016-01-27 | Discharge: 2016-01-27 | Disposition: A | Discharge status: Home / Self Care | Payer: PPO | Source: Ambulatory Visit | Attending: Obstetrics and Gynecology | Admitting: Obstetrics and Gynecology

## 2016-01-27 ENCOUNTER — Encounter (HOSPITAL_BASED_OUTPATIENT_CLINIC_OR_DEPARTMENT_OTHER)
Admission: RE | Disposition: A | Discharge status: Home / Self Care | Payer: Self-pay | Source: Ambulatory Visit | Attending: Obstetrics and Gynecology

## 2016-01-27 DIAGNOSIS — Z87891 Personal history of nicotine dependence: Secondary | ICD-10-CM | POA: Diagnosis not present

## 2016-01-27 DIAGNOSIS — F419 Anxiety disorder, unspecified: Secondary | ICD-10-CM | POA: Insufficient documentation

## 2016-01-27 DIAGNOSIS — N950 Postmenopausal bleeding: Secondary | ICD-10-CM | POA: Insufficient documentation

## 2016-01-27 DIAGNOSIS — Z7982 Long term (current) use of aspirin: Secondary | ICD-10-CM | POA: Diagnosis not present

## 2016-01-27 DIAGNOSIS — E785 Hyperlipidemia, unspecified: Secondary | ICD-10-CM | POA: Diagnosis not present

## 2016-01-27 DIAGNOSIS — E119 Type 2 diabetes mellitus without complications: Secondary | ICD-10-CM | POA: Insufficient documentation

## 2016-01-27 DIAGNOSIS — N840 Polyp of corpus uteri: Secondary | ICD-10-CM | POA: Insufficient documentation

## 2016-01-27 DIAGNOSIS — E039 Hypothyroidism, unspecified: Secondary | ICD-10-CM | POA: Insufficient documentation

## 2016-01-27 DIAGNOSIS — I10 Essential (primary) hypertension: Secondary | ICD-10-CM | POA: Insufficient documentation

## 2016-01-27 DIAGNOSIS — Z79899 Other long term (current) drug therapy: Secondary | ICD-10-CM | POA: Insufficient documentation

## 2016-01-27 HISTORY — DX: Presence of dental prosthetic device (complete) (partial): Z97.2

## 2016-01-27 HISTORY — PX: DILATATION & CURETTAGE/HYSTEROSCOPY WITH MYOSURE: SHX6511

## 2016-01-27 HISTORY — DX: Prediabetes: R73.03

## 2016-01-27 HISTORY — DX: Type 2 diabetes mellitus without complications: E11.9

## 2016-01-27 HISTORY — DX: Cyst of kidney, acquired: N28.1

## 2016-01-27 HISTORY — DX: Polyp of corpus uteri: N84.0

## 2016-01-27 HISTORY — DX: Fibromyalgia: M79.7

## 2016-01-27 HISTORY — DX: Personal history of other specified conditions: Z87.898

## 2016-01-27 HISTORY — DX: Gastro-esophageal reflux disease without esophagitis: K21.9

## 2016-01-27 LAB — POCT I-STAT, CHEM 8
BUN: 12 mg/dL (ref 6–20)
CREATININE: 0.7 mg/dL (ref 0.44–1.00)
Calcium, Ion: 1.26 mmol/L (ref 1.15–1.40)
Chloride: 103 mmol/L (ref 101–111)
Glucose, Bld: 121 mg/dL — ABNORMAL HIGH (ref 65–99)
HEMATOCRIT: 38 % (ref 36.0–46.0)
HEMOGLOBIN: 12.9 g/dL (ref 12.0–15.0)
POTASSIUM: 3.9 mmol/L (ref 3.5–5.1)
SODIUM: 143 mmol/L (ref 135–145)
TCO2: 25 mmol/L (ref 0–100)

## 2016-01-27 LAB — GLUCOSE, CAPILLARY: Glucose-Capillary: 117 mg/dL — ABNORMAL HIGH (ref 65–99)

## 2016-01-27 SURGERY — DILATATION & CURETTAGE/HYSTEROSCOPY WITH MYOSURE
Anesthesia: General | Site: Uterus

## 2016-01-27 MED ORDER — ONDANSETRON HCL 4 MG/2ML IJ SOLN
INTRAMUSCULAR | Status: AC
  Filled 2016-01-27: qty 2

## 2016-01-27 MED ORDER — KETOROLAC TROMETHAMINE 30 MG/ML IJ SOLN
INTRAMUSCULAR | Status: DC | PRN
  Administered 2016-01-27: 30 mg via INTRAVENOUS

## 2016-01-27 MED ORDER — LIDOCAINE 2% (20 MG/ML) 5 ML SYRINGE
INTRAMUSCULAR | Status: DC | PRN
  Administered 2016-01-27: 60 mg via INTRAVENOUS

## 2016-01-27 MED ORDER — LIDOCAINE HCL 1 % IJ SOLN
INTRAMUSCULAR | Status: DC | PRN
  Administered 2016-01-27: 10 mL

## 2016-01-27 MED ORDER — PROPOFOL 10 MG/ML IV BOLUS
INTRAVENOUS | Status: AC
  Filled 2016-01-27: qty 40

## 2016-01-27 MED ORDER — PROPOFOL 10 MG/ML IV BOLUS
INTRAVENOUS | Status: DC | PRN
  Administered 2016-01-27: 170 mg via INTRAVENOUS

## 2016-01-27 MED ORDER — FENTANYL CITRATE (PF) 100 MCG/2ML IJ SOLN
25.0000 ug | INTRAMUSCULAR | Status: DC | PRN
  Filled 2016-01-27: qty 1

## 2016-01-27 MED ORDER — DEXAMETHASONE SODIUM PHOSPHATE 4 MG/ML IJ SOLN
INTRAMUSCULAR | Status: DC | PRN
  Administered 2016-01-27: 10 mg via INTRAVENOUS

## 2016-01-27 MED ORDER — ONDANSETRON HCL 4 MG/2ML IJ SOLN
INTRAMUSCULAR | Status: DC | PRN
  Administered 2016-01-27: 4 mg via INTRAVENOUS

## 2016-01-27 MED ORDER — LACTATED RINGERS IV SOLN
INTRAVENOUS | Status: DC
  Administered 2016-01-27 (×2): via INTRAVENOUS
  Filled 2016-01-27: qty 1000

## 2016-01-27 MED ORDER — EPHEDRINE 5 MG/ML INJ
INTRAVENOUS | Status: AC
Start: 2016-01-27 — End: 2016-01-27
  Filled 2016-01-27: qty 10

## 2016-01-27 MED ORDER — FENTANYL CITRATE (PF) 100 MCG/2ML IJ SOLN
INTRAMUSCULAR | Status: DC | PRN
  Administered 2016-01-27: 50 ug via INTRAVENOUS
  Administered 2016-01-27 (×2): 25 ug via INTRAVENOUS

## 2016-01-27 MED ORDER — LIDOCAINE HCL (CARDIAC) 20 MG/ML IV SOLN: INTRAVENOUS | Status: DC | PRN

## 2016-01-27 MED ORDER — EPHEDRINE SULFATE-NACL 50-0.9 MG/10ML-% IV SOSY
PREFILLED_SYRINGE | INTRAVENOUS | Status: DC | PRN
  Administered 2016-01-27: 10 mg via INTRAVENOUS
  Administered 2016-01-27: 15 mg via INTRAVENOUS

## 2016-01-27 MED ORDER — ARTIFICIAL TEARS OP OINT
TOPICAL_OINTMENT | OPHTHALMIC | Status: AC
  Filled 2016-01-27: qty 3.5

## 2016-01-27 MED ORDER — LIDOCAINE 2% (20 MG/ML) 5 ML SYRINGE
INTRAMUSCULAR | Status: AC
  Filled 2016-01-27: qty 5

## 2016-01-27 MED ORDER — DEXAMETHASONE SODIUM PHOSPHATE 10 MG/ML IJ SOLN
INTRAMUSCULAR | Status: AC
  Filled 2016-01-27: qty 1

## 2016-01-27 MED ORDER — KETOROLAC TROMETHAMINE 30 MG/ML IJ SOLN
INTRAMUSCULAR | Status: AC
Start: 2016-01-27 — End: 2016-01-27
  Filled 2016-01-27: qty 1

## 2016-01-27 MED ORDER — FENTANYL CITRATE (PF) 100 MCG/2ML IJ SOLN
INTRAMUSCULAR | Status: AC
  Filled 2016-01-27: qty 2

## 2016-01-27 MED ORDER — SODIUM CHLORIDE 0.9 % IR SOLN
Status: DC | PRN
  Administered 2016-01-27: 3000 mL

## 2016-01-27 MED ORDER — PROMETHAZINE HCL 25 MG/ML IJ SOLN
6.2500 mg | INTRAMUSCULAR | Status: DC | PRN
  Filled 2016-01-27: qty 1

## 2016-01-27 SURGICAL SUPPLY — 36 items
CANISTER SUCTION 2500CC (MISCELLANEOUS) ×3 IMPLANT
CATH ROBINSON RED A/P 16FR (CATHETERS) ×2 IMPLANT
COVER BACK TABLE 60X90IN (DRAPES) ×3 IMPLANT
DEVICE MYOSURE CLASSIC (MISCELLANEOUS) IMPLANT
DEVICE MYOSURE LITE (MISCELLANEOUS) ×1 IMPLANT
DEVICE MYOSURE REACH (MISCELLANEOUS) IMPLANT
DILATOR CANAL MILEX (MISCELLANEOUS) IMPLANT
DRAPE HYSTEROSCOPY (DRAPE) ×2 IMPLANT
DRAPE LG THREE QUARTER DISP (DRAPES) ×2 IMPLANT
DRSG TELFA 3X8 NADH (GAUZE/BANDAGES/DRESSINGS) ×2 IMPLANT
ELECT REM PT RETURN 9FT ADLT (ELECTROSURGICAL) ×2
ELECTRODE REM PT RTRN 9FT ADLT (ELECTROSURGICAL) ×1 IMPLANT
GLOVE BIO SURGEON STRL SZ 6.5 (GLOVE) ×2 IMPLANT
GLOVE BIOGEL PI IND STRL 6.5 (GLOVE) ×1 IMPLANT
GLOVE BIOGEL PI IND STRL 7.0 (GLOVE) ×1 IMPLANT
GLOVE BIOGEL PI IND STRL 7.5 (GLOVE) IMPLANT
GLOVE BIOGEL PI INDICATOR 6.5 (GLOVE) ×1
GLOVE BIOGEL PI INDICATOR 7.0 (GLOVE) ×2
GLOVE BIOGEL PI INDICATOR 7.5 (GLOVE) ×2
GOWN STRL REUS W/ TWL XL LVL3 (GOWN DISPOSABLE) ×2 IMPLANT
GOWN STRL REUS W/TWL XL LVL3 (GOWN DISPOSABLE) ×4
IV NS IRRIG 3000ML ARTHROMATIC (IV SOLUTION) ×1 IMPLANT
KIT ROOM TURNOVER WOR (KITS) ×2 IMPLANT
LEGGING LITHOTOMY PAIR STRL (DRAPES) ×2 IMPLANT
NS IRRIG 500ML POUR BTL (IV SOLUTION) ×1 IMPLANT
PACK BASIN DAY SURGERY FS (CUSTOM PROCEDURE TRAY) ×2 IMPLANT
PAD DRESSING TELFA 3X8 NADH (GAUZE/BANDAGES/DRESSINGS) ×1 IMPLANT
PAD OB MATERNITY 4.3X12.25 (PERSONAL CARE ITEMS) ×2 IMPLANT
SEAL ROD LENS SCOPE MYOSURE (ABLATOR) ×2 IMPLANT
TOWEL OR 17X24 6PK STRL BLUE (TOWEL DISPOSABLE) ×4 IMPLANT
TRAY DSU PREP LF (CUSTOM PROCEDURE TRAY) ×2 IMPLANT
TUBE CONNECTING 12X1/4 (SUCTIONS) IMPLANT
TUBING AQUILEX INFLOW (TUBING) ×2 IMPLANT
TUBING AQUILEX OUTFLOW (TUBING) ×2 IMPLANT
UNDERPAD 30X30 INCONTINENT (UNDERPADS AND DIAPERS) ×2 IMPLANT
WATER STERILE IRR 500ML POUR (IV SOLUTION) ×2 IMPLANT

## 2016-01-27 NOTE — Discharge Instructions (Signed)
° °   Home care Instructions:   Personal hygiene:  Used sanitary napkins for vaginal drainage not tampons. Shower or tub bathe the day after your procedure. No douching until bleeding stops. Always wipe from front to back after  Elimination.  Activity: Do not drive or operate any equipment today. The effects of the anesthesia are still present and drowsiness may result. Rest today, not necessarily flat bed rest, just take it easy. You may resume your normal activity in one to 2 days.  Sexual activity: No intercourse for one week or as indicated by your physician  Diet: Eat a light diet as desired this evening. You may resume a regular diet tomorrow.  Return to work: One to 2 days.  General Expectations of your surgery: Vaginal bleeding should be no heavier than a normal period. Spotting may continue up to 10 days. Mild cramps may continue for a couple of days. You may have a regular period in 2-6 weeks.  Unexpected observations call your doctor if these occur: persistent or heavy bleeding. Severe abdominal cramping or pain. Elevation of temperature greater than 100F.  Call for an appointment in one week.     Post Anesthesia Home Care Instructions  Activity: Get plenty of rest for the remainder of the day. A responsible adult should stay with you for 24 hours following the procedure.  For the next 24 hours, DO NOT: -Drive a car -Paediatric nurse -Drink alcoholic beverages -Take any medication unless instructed by your physician -Make any legal decisions or sign important papers.  Meals: Start with liquid foods such as gelatin or soup. Progress to regular foods as tolerated. Avoid greasy, spicy, heavy foods. If nausea and/or vomiting occur, drink only clear liquids until the nausea and/or vomiting subsides. Call your physician if vomiting continues.  Special Instructions/Symptoms: Your throat may feel dry or sore from the anesthesia or the breathing tube placed in your throat  during surgery. If this causes discomfort, gargle with warm salt water. The discomfort should disappear within 24 hours.  If you had a scopolamine patch placed behind your ear for the management of post- operative nausea and/or vomiting:  1. The medication in the patch is effective for 72 hours, after which it should be removed.  Wrap patch in a tissue and discard in the trash. Wash hands thoroughly with soap and water. 2. You may remove the patch earlier than 72 hours if you experience unpleasant side effects which may include dry mouth, dizziness or visual disturbances. 3. Avoid touching the patch. Wash your hands with soap and water after contact with the patch.

## 2016-01-27 NOTE — Op Note (Signed)
PREOPERATIVE DIAGNOSES: 1. PMB, suspect polyp  POSTOPERATIVE DIAGNOSES: Same  PROCEDURE PERFORMED: Dilation, curretage, polypectomy, hysteroscopy  SURGEON: Dr. Lucillie Garfinkel  ANESTHESIA: paracervical block and IV sedation  ESTIMATED BLOOD LOSS: 5 cc.  COMPLICATIONS: None  TUBES: None.  DRAINS: None  PATHOLOGY: Endometrial curretings and polyp  FINDINGS: On exam, under anesthesia, normal appearing vulva and vagina, 6 week sized uterus  Operative findings demonstrated one polyp and scant, atrophic endometrium. B/l ostia visualized  Procedure: The patient was taken to the operating room where she was properly prepped and draped in sterile manner under general anesthesia. After bimanual examination, the cervix was exposed with a weighted vaginal speculum and the anterior lip of the cervix grasped with a tenaculum.  The endocervical canal was then progressively dilated to 84mm. The hysteroscope was then introduced into the uterine cavity using sterile saline solution as a distending media and with attached video camera. The endometrial cavity was distended with fluids and the cavity with the b/l ostia were visualized. Atrophic endometrium was noted as well as a ~1cm polyp. The myosure device was introduced and the polyp easily removed. Sharp curretage was then performed. The scope was reintroduced and several pictures were taken of the endometrial cavity and the hysteroscope removed from the cavity. All instruments removed from uterus, cervix, and vagina. The sponge and lap counts were correct times 2 at this time. The patient's procedure was terminated. We then awakened her. She was sent to the Recovery Room in good condition.    Lucillie Garfinkel MD

## 2016-01-27 NOTE — Brief Op Note (Signed)
01/27/2016  9:22 AM  PATIENT:  Cathy Page  65 y.o. female  PRE-OPERATIVE DIAGNOSIS:  polyp  POST-OPERATIVE DIAGNOSIS:  POLYP  PROCEDURE:  Procedure(s): DILATATION & CURETTAGE/HYSTEROSCOPY WITH MYOSURE AND ENDOMETRIAL CURETTAGE (N/A)  SURGEON:  Surgeon(s) and Role:    * Tyson Dense, MD - Primary  PHYSICIAN ASSISTANT:   ASSISTANTS: none   ANESTHESIA:   IV sedation and paracervical block  EBL:  Total I/O In: [id number] [P.O.:240; I.V.:950] Out: 5 [Blood:5]  BLOOD ADMINISTERED:none  DRAINS: none   LOCAL MEDICATIONS USED:  LIDOCAINE  and Amount: 10 ml  SPECIMEN:  Scraping and polyp  DISPOSITION OF SPECIMEN:  PATHOLOGY  COUNTS:  YES  TOURNIQUET:  * No tourniquets in log *  DICTATION: .Note written in EPIC  PLAN OF CARE: Discharge to home after PACU  PATIENT DISPOSITION:  PACU - hemodynamically stable.   Delay start of Pharmacological VTE agent (>24hrs) due to surgical blood loss or risk of bleeding: not applicable

## 2016-01-27 NOTE — Anesthesia Procedure Notes (Signed)
Procedure Name: LMA Insertion Date/Time: 01/27/2016 7:37 AM Performed by: Myrtie Soman Pre-anesthesia Checklist: Patient identified, Emergency Drugs available, Suction available and Patient being monitored Patient Re-evaluated:Patient Re-evaluated prior to inductionOxygen Delivery Method: Circle system utilized Preoxygenation: Pre-oxygenation with 100% oxygen Intubation Type: IV induction Ventilation: Mask ventilation without difficulty LMA: LMA inserted LMA Size: 4.0 Number of attempts: 1 Airway Equipment and Method: Bite block Placement Confirmation: positive ETCO2 Tube secured with: Tape Dental Injury: Teeth and Oropharynx as per pre-operative assessment

## 2016-01-27 NOTE — Interval H&P Note (Signed)
History and Physical Interval Note:  01/27/2016 7:21 AM  Cathy Page  has presented today for surgery, with the diagnosis of polyp  The various methods of treatment have been discussed with the patient and family. After consideration of risks, benefits and other options for treatment, the patient has consented to  Procedure(s): [place] (N/A) as a surgical intervention .  The patient's history has been reviewed, patient examined, no change in status, stable for surgery.  I have reviewed the patient's chart and labs.  Questions were answered to the patient's satisfaction.     Cathy Page

## 2016-01-27 NOTE — Anesthesia Postprocedure Evaluation (Addendum)
Anesthesia Post Note  Patient: Cathy Page  Procedure(s) Performed: Procedure(s) (LRB): DILATATION & CURETTAGE/HYSTEROSCOPY WITH MYOSURE AND ENDOMETRIAL CURETTAGE (N/A)  Patient location during evaluation: PACU Anesthesia Type: General Level of consciousness: awake and alert Pain management: pain level controlled Vital Signs Assessment: post-procedure vital signs reviewed and stable Respiratory status: spontaneous breathing, nonlabored ventilation, respiratory function stable and patient connected to nasal cannula oxygen Cardiovascular status: blood pressure returned to baseline and stable Postop Assessment: no signs of nausea or vomiting Anesthetic complications: no       Last Vitals:  Vitals:   01/27/16 0830 01/27/16 0845  BP: 114/70 110/72  Pulse: 94 92  Resp: 10 12  Temp:      Last Pain:  Vitals:   01/27/16 0830  TempSrc:   PainSc: 3                  Jazilyn Siegenthaler S

## 2016-01-27 NOTE — Anesthesia Preprocedure Evaluation (Signed)
Anesthesia Evaluation  Patient identified by MRN, date of birth, ID band Patient awake    Reviewed: Allergy & Precautions, NPO status , Patient's Chart, lab work & pertinent test results  Airway Mallampati: II  TM Distance: >3 FB Neck ROM: Full    Dental no notable dental hx.    Pulmonary neg pulmonary ROS, former smoker,    Pulmonary exam normal breath sounds clear to auscultation       Cardiovascular hypertension, Normal cardiovascular exam Rhythm:Regular Rate:Normal     Neuro/Psych negative neurological ROS  negative psych ROS   GI/Hepatic negative GI ROS, Neg liver ROS,   Endo/Other  diabetes  Renal/GU negative Renal ROS  negative genitourinary   Musculoskeletal negative musculoskeletal ROS (+)   Abdominal   Peds negative pediatric ROS (+)  Hematology negative hematology ROS (+)   Anesthesia Other Findings   Reproductive/Obstetrics negative OB ROS                             Anesthesia Physical Anesthesia Plan  ASA: II  Anesthesia Plan: General   Post-op Pain Management:    Induction: Intravenous  Airway Management Planned: LMA  Additional Equipment:   Intra-op Plan:   Post-operative Plan: Extubation in OR  Informed Consent: I have reviewed the patients History and Physical, chart, labs and discussed the procedure including the risks, benefits and alternatives for the proposed anesthesia with the patient or authorized representative who has indicated his/her understanding and acceptance.   Dental advisory given  Plan Discussed with: CRNA and Surgeon  Anesthesia Plan Comments:         Anesthesia Quick Evaluation

## 2016-01-27 NOTE — Transfer of Care (Signed)
Immediate Anesthesia Transfer of Care Note  Patient: Cathy Page  Procedure(s) Performed: Procedure(s) (LRB): DILATATION & CURETTAGE/HYSTEROSCOPY WITH MYOSURE (N/A)  Patient Location: PACU  Anesthesia Type: General  Level of Consciousness: awake, alert  and oriented  Airway & Oxygen Therapy: Patient Spontanous Breathing and Patient connected to nasal cannula oxygen  Post-op Assessment: Report given to PACU RN and Post -op Vital signs reviewed and stable  Post vital signs: Reviewed and stable  Complications: No apparent anesthesia complications  Last Vitals:  Vitals:   01/27/16 0606  BP: 123/77  Pulse: (!) 104  Resp: 18  Temp: 36.6 C    Last Pain:  Vitals:   01/27/16 0606  TempSrc: Oral      Patients Stated Pain Goal: 3 (01/27/16 0620)

## 2016-01-28 ENCOUNTER — Encounter (HOSPITAL_BASED_OUTPATIENT_CLINIC_OR_DEPARTMENT_OTHER): Payer: Self-pay | Admitting: Obstetrics and Gynecology

## 2016-02-03 DIAGNOSIS — Z6826 Body mass index (BMI) 26.0-26.9, adult: Secondary | ICD-10-CM | POA: Diagnosis not present

## 2016-02-03 DIAGNOSIS — R079 Chest pain, unspecified: Secondary | ICD-10-CM | POA: Diagnosis not present

## 2016-02-03 DIAGNOSIS — F411 Generalized anxiety disorder: Secondary | ICD-10-CM | POA: Diagnosis not present

## 2016-02-03 DIAGNOSIS — K219 Gastro-esophageal reflux disease without esophagitis: Secondary | ICD-10-CM | POA: Diagnosis not present

## 2016-02-09 ENCOUNTER — Institutional Professional Consult (permissible substitution): Payer: Medicare Other | Admitting: Internal Medicine

## 2016-02-26 DIAGNOSIS — H04123 Dry eye syndrome of bilateral lacrimal glands: Secondary | ICD-10-CM | POA: Diagnosis not present

## 2016-02-26 DIAGNOSIS — H40023 Open angle with borderline findings, high risk, bilateral: Secondary | ICD-10-CM | POA: Diagnosis not present

## 2016-02-27 ENCOUNTER — Telehealth: Payer: Self-pay | Admitting: *Deleted

## 2016-02-27 NOTE — Telephone Encounter (Signed)
I called to get any recent records on this patient, being that we haven't seen her in 4 years.

## 2016-03-10 ENCOUNTER — Ambulatory Visit: Payer: PPO | Admitting: Cardiology

## 2016-03-11 ENCOUNTER — Encounter: Payer: Self-pay | Admitting: Cardiology

## 2016-03-18 DIAGNOSIS — R1013 Epigastric pain: Secondary | ICD-10-CM | POA: Diagnosis not present

## 2016-03-31 DIAGNOSIS — R12 Heartburn: Secondary | ICD-10-CM | POA: Diagnosis not present

## 2016-03-31 DIAGNOSIS — R1013 Epigastric pain: Secondary | ICD-10-CM | POA: Diagnosis not present

## 2016-03-31 DIAGNOSIS — K317 Polyp of stomach and duodenum: Secondary | ICD-10-CM | POA: Diagnosis not present

## 2016-03-31 DIAGNOSIS — K2930 Chronic superficial gastritis without bleeding: Secondary | ICD-10-CM | POA: Diagnosis not present

## 2016-04-05 DIAGNOSIS — R300 Dysuria: Secondary | ICD-10-CM | POA: Diagnosis not present

## 2016-04-05 DIAGNOSIS — N390 Urinary tract infection, site not specified: Secondary | ICD-10-CM | POA: Diagnosis not present

## 2016-04-07 DIAGNOSIS — K2930 Chronic superficial gastritis without bleeding: Secondary | ICD-10-CM | POA: Diagnosis not present

## 2016-04-07 DIAGNOSIS — K317 Polyp of stomach and duodenum: Secondary | ICD-10-CM | POA: Diagnosis not present

## 2016-04-26 DIAGNOSIS — Z23 Encounter for immunization: Secondary | ICD-10-CM | POA: Diagnosis not present

## 2016-05-04 ENCOUNTER — Ambulatory Visit: Payer: Self-pay

## 2016-05-07 DIAGNOSIS — M9902 Segmental and somatic dysfunction of thoracic region: Secondary | ICD-10-CM | POA: Diagnosis not present

## 2016-05-07 DIAGNOSIS — M9901 Segmental and somatic dysfunction of cervical region: Secondary | ICD-10-CM | POA: Diagnosis not present

## 2016-05-07 DIAGNOSIS — M5134 Other intervertebral disc degeneration, thoracic region: Secondary | ICD-10-CM | POA: Diagnosis not present

## 2016-05-07 DIAGNOSIS — M50322 Other cervical disc degeneration at C5-C6 level: Secondary | ICD-10-CM | POA: Diagnosis not present

## 2016-05-10 DIAGNOSIS — M9902 Segmental and somatic dysfunction of thoracic region: Secondary | ICD-10-CM | POA: Diagnosis not present

## 2016-05-10 DIAGNOSIS — M50322 Other cervical disc degeneration at C5-C6 level: Secondary | ICD-10-CM | POA: Diagnosis not present

## 2016-05-10 DIAGNOSIS — M9901 Segmental and somatic dysfunction of cervical region: Secondary | ICD-10-CM | POA: Diagnosis not present

## 2016-05-10 DIAGNOSIS — M5134 Other intervertebral disc degeneration, thoracic region: Secondary | ICD-10-CM | POA: Diagnosis not present

## 2016-05-21 DIAGNOSIS — N390 Urinary tract infection, site not specified: Secondary | ICD-10-CM | POA: Diagnosis not present

## 2016-05-21 DIAGNOSIS — E782 Mixed hyperlipidemia: Secondary | ICD-10-CM | POA: Diagnosis not present

## 2016-05-21 DIAGNOSIS — E079 Disorder of thyroid, unspecified: Secondary | ICD-10-CM | POA: Diagnosis not present

## 2016-05-24 DIAGNOSIS — N390 Urinary tract infection, site not specified: Secondary | ICD-10-CM | POA: Diagnosis not present

## 2016-05-24 DIAGNOSIS — N281 Cyst of kidney, acquired: Secondary | ICD-10-CM | POA: Diagnosis not present

## 2016-05-24 DIAGNOSIS — R300 Dysuria: Secondary | ICD-10-CM | POA: Diagnosis not present

## 2016-05-25 DIAGNOSIS — M542 Cervicalgia: Secondary | ICD-10-CM | POA: Diagnosis not present

## 2016-05-25 DIAGNOSIS — M2011 Hallux valgus (acquired), right foot: Secondary | ICD-10-CM | POA: Diagnosis not present

## 2016-05-26 DIAGNOSIS — I10 Essential (primary) hypertension: Secondary | ICD-10-CM | POA: Diagnosis not present

## 2016-05-26 DIAGNOSIS — F411 Generalized anxiety disorder: Secondary | ICD-10-CM | POA: Diagnosis not present

## 2016-05-26 DIAGNOSIS — E782 Mixed hyperlipidemia: Secondary | ICD-10-CM | POA: Diagnosis not present

## 2016-05-26 DIAGNOSIS — E039 Hypothyroidism, unspecified: Secondary | ICD-10-CM | POA: Diagnosis not present

## 2016-06-08 DIAGNOSIS — H40023 Open angle with borderline findings, high risk, bilateral: Secondary | ICD-10-CM | POA: Diagnosis not present

## 2016-06-08 DIAGNOSIS — H2513 Age-related nuclear cataract, bilateral: Secondary | ICD-10-CM | POA: Diagnosis not present

## 2016-06-08 DIAGNOSIS — H02833 Dermatochalasis of right eye, unspecified eyelid: Secondary | ICD-10-CM | POA: Diagnosis not present

## 2016-06-08 DIAGNOSIS — H25013 Cortical age-related cataract, bilateral: Secondary | ICD-10-CM | POA: Diagnosis not present

## 2016-06-14 NOTE — Addendum Note (Signed)
Addendum  created 06/14/16 1005 by Kaoir Loree, MD   Sign clinical note    

## 2016-07-02 DIAGNOSIS — N952 Postmenopausal atrophic vaginitis: Secondary | ICD-10-CM | POA: Diagnosis not present

## 2016-07-02 DIAGNOSIS — R300 Dysuria: Secondary | ICD-10-CM | POA: Diagnosis not present

## 2016-07-02 DIAGNOSIS — N3010 Interstitial cystitis (chronic) without hematuria: Secondary | ICD-10-CM | POA: Diagnosis not present

## 2016-07-02 DIAGNOSIS — N390 Urinary tract infection, site not specified: Secondary | ICD-10-CM | POA: Diagnosis not present

## 2016-09-07 DIAGNOSIS — H43393 Other vitreous opacities, bilateral: Secondary | ICD-10-CM | POA: Diagnosis not present

## 2016-09-07 DIAGNOSIS — H43813 Vitreous degeneration, bilateral: Secondary | ICD-10-CM | POA: Diagnosis not present

## 2016-09-15 DIAGNOSIS — E079 Disorder of thyroid, unspecified: Secondary | ICD-10-CM | POA: Diagnosis not present

## 2016-09-16 DIAGNOSIS — E782 Mixed hyperlipidemia: Secondary | ICD-10-CM | POA: Diagnosis not present

## 2016-09-17 DIAGNOSIS — F411 Generalized anxiety disorder: Secondary | ICD-10-CM | POA: Diagnosis not present

## 2016-09-17 DIAGNOSIS — Z6827 Body mass index (BMI) 27.0-27.9, adult: Secondary | ICD-10-CM | POA: Diagnosis not present

## 2016-09-17 DIAGNOSIS — E039 Hypothyroidism, unspecified: Secondary | ICD-10-CM | POA: Diagnosis not present

## 2016-09-17 DIAGNOSIS — E782 Mixed hyperlipidemia: Secondary | ICD-10-CM | POA: Diagnosis not present

## 2016-10-12 DIAGNOSIS — H5319 Other subjective visual disturbances: Secondary | ICD-10-CM | POA: Diagnosis not present

## 2016-10-12 DIAGNOSIS — H40051 Ocular hypertension, right eye: Secondary | ICD-10-CM | POA: Diagnosis not present

## 2016-10-12 DIAGNOSIS — H40023 Open angle with borderline findings, high risk, bilateral: Secondary | ICD-10-CM | POA: Diagnosis not present

## 2016-11-08 ENCOUNTER — Encounter (INDEPENDENT_AMBULATORY_CARE_PROVIDER_SITE_OTHER): Payer: Self-pay

## 2016-11-08 ENCOUNTER — Encounter: Payer: Self-pay | Admitting: Cardiology

## 2016-11-08 ENCOUNTER — Ambulatory Visit (INDEPENDENT_AMBULATORY_CARE_PROVIDER_SITE_OTHER): Payer: PPO | Admitting: Cardiology

## 2016-11-08 VITALS — BP 116/72 | HR 105 | Resp 16 | Ht 65.0 in | Wt 162.0 lb

## 2016-11-08 DIAGNOSIS — R079 Chest pain, unspecified: Secondary | ICD-10-CM

## 2016-11-08 NOTE — Progress Notes (Signed)
11/08/2016 Cathy Page   [birth year]  [id number]  Primary Physician Fanny Bien, MD Primary Cardiologist: Dr. Radford Pax   Reason for Visit/CC: Chest Pain   HPI:  Cathy Page is a 66 y.o. female who is being seen today for the evaluation of Chest Pain. Cardiac risk factors include HTN, HLD and prediabetes. She was being followed by Dr. Radford Pax back in 2014. She was seen then for CP and had a normal stress test. Not seen in clinic since then but we performed NST on her recently earlier this year.   She was admitted to [facility] January 2018 for chest pain. She ruled out for MI. She underwent a NST that was negative for ischemia. No scar. EF hyperdynamic at > 74%. Stress test was monitored by Rosaria Ferries, PA-C.   Pt not seen back in clinic since discharge.  She is back today given complaints of recurrent substernal chest discomfort that feels like pressure and tightness.  Symptoms mainly occur whenever she is emotionally upset.  She reports that she has been under a lot of stress here recently.  Her husband's health has declined and she is his primary caregiver.  She also  has noticed some radiation occasionally to the left posterior shoulder down her left arm.  She denies any chest pain in the absence of emotional stress.  She occasionally sometimes gets winded with moderate physical activity.  She is currently chest pain-free.  EKG shows normal sinus rhythm.  No ischemic abnormalities.  Blood pressure is well controlled with lisinopril.  She notes a significant family history of premature CAD.  Her mother required quadruple bypass surgery in her early 49s.  Her maternal grandfather also had a massive heart attack in his 9s.  Current Meds  Medication Sig  . ALPRAZolam (XANAX) 1 MG tablet Take 0.5 mg by mouth 2 (two) times daily as needed for anxiety.  . Alum & Mag Hydroxide-Simeth (ECK ANTACID PLUS ANTI-GAS PO) Take by mouth as needed. [employer] brand antacid pill as needed  . aspirin  81 MG chewable tablet Chew 81 mg by mouth daily.  . Cholecalciferol (VITAMIN D-3) 1000 UNITS CAPS Take 3,000 Units by mouth daily.  . Coenzyme Q10 (CO Q 10 PO) Take 1 tablet by mouth daily.  . Cyanocobalamin (B-12) 5000 MCG SUBL Place under the tongue daily.  . diphenhydrAMINE (BENADRYL) 25 mg capsule Take 25 mg by mouth daily as needed for allergies.  . Docosahexaenoic Acid (DHA COMPLETE PO) Take by mouth daily. SUPER DHA  . Docosahexaenoic Acid (DHA OMEGA 3) 100 MG CAPS Take by mouth daily.  . Estradiol 0.5 MG/0.5GM GEL Place onto the skin as directed.   . fish oil-omega-3 fatty acids 1000 MG capsule Take 2 g by mouth daily.  Marland Kitchen levothyroxine (SYNTHROID, LEVOTHROID) 100 MCG tablet Take 100 mcg by mouth daily before breakfast.  . liothyronine (CYTOMEL) 5 MCG tablet Take 10 mcg by mouth 2 (two) times daily.   Marland Kitchen lisinopril (PRINIVIL,ZESTRIL) 20 MG tablet Take 10 mg by mouth every morning.  . Magnesium 500 MG CAPS Take 1 capsule by mouth daily.  . misoprostol (CYTOTEC) 100 MCG tablet Place 100 mcg vaginally as directed. 1 tablet hs before procedure and am dos 01-27-2016  . Multiple Vitamins-Minerals (MULTIVITAMIN PO) Take 1 tablet by mouth daily.  . Nattokinase 100 MG CAPS Take by mouth daily.  . Probiotic Product (PROBIOTIC DAILY) CAPS Take by mouth daily.  . vitamin C (ASCORBIC ACID) 500 MG tablet Take 500 mg by  mouth daily.   Allergies  Allergen Reactions  . Sulfa Antibiotics Anaphylaxis, Hives, Shortness Of Breath and Swelling  . Codeine Nausea And Vomiting  . Hydrocodone Nausea And Vomiting   Past Medical History:  Diagnosis Date  . Anxiety   . Bilateral renal cysts    simple  . Diverticulosis   . Endometrial polyp   . Fibromyalgia   . GERD (gastroesophageal reflux disease)   . History of palpitations    cardiologist consult 2013 w/ dr Tressia Miners turner-- felt to be due to increasing thyroid medication on her own  . Hx of leukocytosis 11/30/2012  . Hyperlipidemia   . Hypertension     . Interstitial cystitis    Chronic, intermittent  . Pre-diabetes   . Type 2 diabetes mellitus ([place])   . Wears partial dentures    lower   Family History  Problem Relation Age of Onset  . Diabetes Father   . CAD Mother    Past Surgical History:  Procedure Laterality Date  . CARDIOVASCULAR STRESS TEST  01/21/2016   normal nuclear study w/ no ischemia/  normal LV function and wall motion , stress ef 74%  . CESAREAN SECTION  x3  last one 1980   BILATERAL TUBAL LIGATION  W/ LAST C/S  . COLONOSCOPY  last one 2014  . D & C  FAILED HYSTEROSCOPY  04-23-2003  dr Bing Matter   severe anteverted uterus  . DILATATION & CURETTAGE/HYSTEROSCOPY WITH MYOSURE N/A 01/27/2016   Procedure: DILATATION & CURETTAGE/HYSTEROSCOPY WITH MYOSURE AND ENDOMETRIAL CURETTAGE;  Surgeon: Tyson Dense, MD;  Location: [facility];  Service: Gynecology;  Laterality: N/A;  . EXPLORATORY LAPAROTOMY/  OVARIAN CYSTECTOMY  1976   w/  APPENDECTOMY   Social History   Social History  . Marital status: Married    Spouse name: N/A  . Number of children: N/A  . Years of education: N/A   Occupational History  . Not on file.   Social History Main Topics  . Smoking status: Former Smoker    Years: 10.00    Types: Cigarettes    Quit date: 01/21/1978  . Smokeless tobacco: Never Used     Comment: hx social smoker 1ppd 3 wks  . Alcohol use Yes     Comment: OCCASIONAL  . Drug use: No  . Sexual activity: Not on file   Other Topics Concern  . Not on file   Social History Narrative  . No narrative on file     Review of Systems: General: negative for chills, fever, night sweats or weight changes.  Cardiovascular: negative for chest pain, dyspnea on exertion, edema, orthopnea, palpitations, paroxysmal nocturnal dyspnea or shortness of breath Dermatological: negative for rash Respiratory: negative for cough or wheezing Urologic: negative for hematuria Abdominal: negative for nausea, vomiting,  diarrhea, bright red blood per rectum, melena, or hematemesis Neurologic: negative for visual changes, syncope, or dizziness All other systems reviewed and are otherwise negative except as noted above.   Physical Exam:  Blood pressure 116/72, pulse (!) 105, resp. rate 16, height 5\' 5"  (1.651 m), weight 162 lb (73.5 kg), SpO2 97 %.  General appearance: alert, cooperative and no distress Neck: no carotid bruit and no JVD Lungs: clear to auscultation bilaterally Heart: regular rate and rhythm, S1, S2 normal, no murmur, click, rub or gallop Extremities: extremities normal, atraumatic, no cyanosis or edema Pulses: 2+ and symmetric Skin: Skin color, texture, turgor normal. No rashes or lesions Neurologic: Grossly normal  EKG NSR no ischemia --  personally reviewed   ASSESSMENT AND PLAN:   66 year old female with a family history of premature coronary artery disease (mother with quadruple bypass in her early 36s and maternal grandfather with massive heart attack in his 57s), as well as personal history of hypertension, hyperlipidemia and prediabetes, presenting with complaints of intermittent substernal chest pain/tightness brought on by emotional stress.  She has had 2- stress test in the past, the most recent performed earlier this year in January, that have both been low risk. She has concerns regarding persistent symptoms and possibility for false negative stress test. Given her persistent symptoms despite normal stress test as well as her cardiac risk factors, we will arrange for her to be assessed by coronary CTA with morphology to better screen for underlying coronary artery disease.  Follow-up after test is done to review results and need for any additional testing.    Avigail Pilling Ladoris Gene, MHS [facility] 11/08/2016 4:49 PM

## 2016-11-08 NOTE — Patient Instructions (Signed)
Medication Instructions:   Your physician recommends that you continue on your current medications as directed. Please refer to the Current Medication list given to you today.   If you need a refill on your cardiac medications before your next appointment, please call your pharmacy.  Labwork:   BMET  TODAY     Testing/Procedures: Non-Cardiac CT Angiography (CTA), is a special type of CT scan that uses a computer to produce multi-dimensional views of major blood vessels throughout the body. In CT angiography, a contrast material is injected through an IV to help visualize the blood vessels   Follow-Up:   AFTER CT SCAN    Any Other Special Instructions Will Be Listed Below (If Applicable).

## 2016-11-09 ENCOUNTER — Telehealth: Payer: Self-pay

## 2016-11-09 LAB — BASIC METABOLIC PANEL
BUN/Creatinine Ratio: 14 (ref 12–28)
BUN: 12 mg/dL (ref 8–27)
CALCIUM: 9.6 mg/dL (ref 8.7–10.3)
CHLORIDE: 102 mmol/L (ref 96–106)
CO2: 27 mmol/L (ref 20–29)
Creatinine, Ser: 0.83 mg/dL (ref 0.57–1.00)
GFR calc non Af Amer: 73 mL/min/{1.73_m2} (ref >59)
GFR, EST AFRICAN AMERICAN: 84 mL/min/{1.73_m2} (ref >59)
Glucose: 99 mg/dL (ref 65–99)
POTASSIUM: 4.1 mmol/L (ref 3.5–5.2)
SODIUM: 143 mmol/L (ref 134–144)

## 2016-11-09 NOTE — Telephone Encounter (Signed)
spoke with patient about recent lab results. patient verbalized understanding. patient thanked me for my call. 

## 2016-11-23 ENCOUNTER — Encounter: Payer: Self-pay | Admitting: Cardiology

## 2016-11-23 DIAGNOSIS — Z1231 Encounter for screening mammogram for malignant neoplasm of breast: Secondary | ICD-10-CM | POA: Diagnosis not present

## 2016-11-25 ENCOUNTER — Encounter: Payer: Self-pay | Admitting: Internal Medicine

## 2016-11-25 ENCOUNTER — Ambulatory Visit: Payer: PPO | Admitting: Internal Medicine

## 2016-11-25 VITALS — BP 128/80 | HR 92 | Ht 64.0 in | Wt 160.0 lb

## 2016-11-25 DIAGNOSIS — R079 Chest pain, unspecified: Secondary | ICD-10-CM

## 2016-11-25 NOTE — Progress Notes (Signed)
Subjective:     Patient ID: Cathy Page, female   DOB: [birth year],    MRN: [id number]  HPI  30  yowf quit smoking 1985  With hx  husband worked in heat/ a/c and she was freq exp to husband dusty clothes ? How was asbestos  >>  self-referred to pulmonary clinic 11/25/2016  for migrating chest and abd pain x fall 2017     11/25/2016 1st [facility] office visit/ Kooper Chriswell   Chief Complaint  Patient presents with  . Pulmonary Consult    Self referral. Pt c/o "pains across chest" and back pain off and on for the past year. She states the pain sometimes wakes her up in the night. She states she "coughs like normal people" "allergies".  1st noted x fall 2017 localized intermittent L post chest pain dull ache   Typically lasts 15-20 min /never wakes her up / nothing aggravates or seems to alleviate and no assoc shoulder pain or pleuritic/ex features  Another location cp = diffuse, ant arrived after the first lasts up to half an hour/ no pleuritic features and not exac by treadmill ex  3rd pain epigastrium about the same time frame but denies ever having the pain more than 30 min and never has more than one location at a time  Cardiac eval by [facility]  GI eval by dr Teena Irani included nl u/s gb / could not tol ppi / afraid to try zantac    No obvious day to day or daytime variability or assoc excess/ purulent sputum or mucus plugs or hemoptysis or chest tightness, subjective wheeze or overt sinus or hb symptoms. No unusual exp hx or h/o childhood pna/ asthma or knowledge of premature birth.  Sleeping ok flat without nocturnal  or early am exacerbation  of respiratory  c/o's or need for noct saba. Also denies any obvious fluctuation of symptoms with weather or environmental changes or other aggravating or alleviating factors except as outlined above   Current Allergies, Complete Past Medical History, Past Surgical History, Family History, and Social History were reviewed in [facility] record.  ROS  The following are not active complaints unless bolded Hoarseness, sore throat, dysphagia, dental problems, itching, sneezing,  nasal congestion or discharge of excess mucus or purulent secretions, ear ache,   fever, chills, sweats, unintended wt loss or wt gain, classically pleuritic or exertional cp,  orthopnea pnd or leg swelling, presyncope, palpitations, abdominal pain, anorexia, nausea, vomiting, diarrhea  or change in bowel habits or change in bladder habits, change in stools or change in urine, dysuria, hematuria,  rash, arthralgias, visual complaints, headache, numbness, weakness or ataxia or problems with walking or coordination,  change in mood/affect or memory.        Current Meds  Medication Sig  . ALPRAZolam (XANAX) 1 MG tablet Take 0.5 mg by mouth 2 (two) times daily as needed for anxiety.  Marland Kitchen aspirin 81 MG chewable tablet Chew 81 mg by mouth daily.  . Cholecalciferol (VITAMIN D-3) 1000 UNITS CAPS Take 3,000 Units by mouth daily.  . Coenzyme Q10 (CO Q 10 PO) Take 1 tablet by mouth daily.  . Cyanocobalamin (B-12) 5000 MCG SUBL Place under the tongue daily.  . diphenhydrAMINE (BENADRYL) 25 mg capsule Take 25 mg by mouth daily as needed for allergies.  . fish oil-omega-3 fatty acids 1000 MG capsule Take 2 g by mouth daily.  Marland Kitchen levothyroxine (SYNTHROID, LEVOTHROID) 50 MCG tablet Take 1 tablet daily by mouth.  Marland Kitchen  liothyronine (CYTOMEL) 5 MCG tablet Take 10 mcg by mouth 2 (two) times daily.   Marland Kitchen lisinopril (PRINIVIL,ZESTRIL) 20 MG tablet Take 10 mg by mouth every morning.  . Magnesium 500 MG CAPS Take 1 capsule by mouth daily.  . Multiple Vitamins-Minerals (MULTIVITAMIN PO) Take 1 tablet by mouth daily.  . Probiotic Product (PROBIOTIC DAILY) CAPS Take by mouth daily.           Review of Systems     Objective:   Physical Exam     Wt Readings from Last 3 Encounters:  11/25/16 160 lb (72.6 kg)  11/08/16 162 lb (73.5 kg)  01/27/16 159 lb (72.1  kg)    Vital signs reviewed  - Note on arrival 02 sats  97% on RA     HEENT: nl dentition, turbinates bilaterally, and oropharynx. Nl external ear canals without cough reflex   NECK :  without JVD/Nodes/TM/ nl carotid upstrokes bilaterally   LUNGS: no acc muscle use,  Nl contour chest which is clear to A and P bilaterally without cough on insp or exp maneuvers   CV:  RRR  no s3 or murmur or increase in P2, and no edema   ABD:  soft and nontender with nl inspiratory excursion in the supine position. No bruits or organomegaly appreciated, bowel sounds nl  MS:  Nl gait/ ext warm without deformities, calf tenderness, cyanosis or clubbing No obvious joint restrictions   SKIN: warm and dry without lesions    NEURO:  alert, approp, nl sensorium with  no motor or cerebellar deficits apparent.           Assessment:

## 2016-11-25 NOTE — Assessment & Plan Note (Signed)
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   rec she start a diary now of when the pains start and stop and start ibs diet/ citrucel with f/u with Dr Amedeo Plenty if the cardiac w/u is fruitless (includes CTa of chest so return here if this is abn)   Total time devoted to counseling  > 50 % of initial 60 min office visit:  review case with pt/ discussion of options/alternatives/ personally creating written customized instructions  in presence of pt  then going over those specific  Instructions directly with the pt including how to use all of the meds but in particular covering each new medication in detail and the difference between the maintenance= "automatic" meds and the prns using an action plan format for the latter (If this problem/symptom => do that organization reading Left to right).  Please see AVS from this visit for a full list of these instructions which I personally wrote for this pt and  are unique to this visit.

## 2016-11-25 NOTE — Patient Instructions (Addendum)
You need to start a diary of when you pains start and stop noticing the pains   rec zantac 150 mg twice daily after meals  GERD (REFLUX)  is an extremely common cause of respiratory symptoms just like yours , many times with no obvious heartburn at all.    It can be treated with medication, but also with lifestyle changes including elevation of the head of your bed (ideally with 6 inch  bed blocks),  Smoking cessation, avoidance of late meals, excessive alcohol, and avoid fatty foods, chocolate, peppermint, colas, red wine, and acidic juices such as orange juice.  NO MINT OR MENTHOL PRODUCTS SO NO COUGH DROPS  USE SUGARLESS CANDY INSTEAD (Jolley ranchers or Stover's or Life Savers) or even ice chips will also do - the key is to swallow to prevent all throat clearing. NO OIL BASED VITAMINS - use powdered substitutes.     Classic subdiaphragmatic pain pattern suggests ibs:  Stereotypical, migratory with a very limited distribution of pain locations, daytime, not usually exacerbated by exercise  or coughing, worse in sitting position, frequently associated with generalized abd bloating, not as likely to be present supine due to the dome effect of the diaphragm which  is  canceled in that position. Frequently these patients have had multiple negative GI workups and CT scans.  Treatment consists of avoiding foods that cause gas (especially boiled eggs, mexcican food but especially  beans and undercooked vegetables like  spinach and some salads)  and citrucel 1 heaping tsp twice daily with a large glass of water.  Pain should improve w/in 2 weeks and if not then consider further GI work up.      Pulmonary follow up may be indicated depending on the results of your consult.

## 2016-12-09 ENCOUNTER — Ambulatory Visit (HOSPITAL_COMMUNITY)
Admission: RE | Admit: 2016-12-09 | Discharge: 2016-12-09 | Disposition: A | Discharge status: Home / Self Care | Payer: PPO | Source: Ambulatory Visit | Attending: Cardiology | Admitting: Cardiology

## 2016-12-09 ENCOUNTER — Ambulatory Visit (HOSPITAL_COMMUNITY): Admission: RE | Admit: 2016-12-09 | Payer: PPO | Source: Ambulatory Visit

## 2016-12-09 DIAGNOSIS — R079 Chest pain, unspecified: Secondary | ICD-10-CM | POA: Diagnosis not present

## 2016-12-09 DIAGNOSIS — R0789 Other chest pain: Secondary | ICD-10-CM | POA: Diagnosis not present

## 2016-12-09 DIAGNOSIS — I2510 Atherosclerotic heart disease of native coronary artery without angina pectoris: Secondary | ICD-10-CM | POA: Diagnosis not present

## 2016-12-09 DIAGNOSIS — K760 Fatty (change of) liver, not elsewhere classified: Secondary | ICD-10-CM | POA: Diagnosis not present

## 2016-12-09 MED ORDER — NITROGLYCERIN 0.4 MG SL SUBL
0.8000 mg | SUBLINGUAL_TABLET | Freq: Once | SUBLINGUAL | Status: AC
  Administered 2016-12-09: 0.8 mg via SUBLINGUAL

## 2016-12-09 MED ORDER — NITROGLYCERIN 0.4 MG SL SUBL
SUBLINGUAL_TABLET | SUBLINGUAL | Status: AC
  Administered 2016-12-09: 0.8 mg via SUBLINGUAL
  Filled 2016-12-09: qty 2

## 2016-12-09 MED ORDER — METOPROLOL TARTRATE 5 MG/5ML IV SOLN
INTRAVENOUS | Status: AC
  Administered 2016-12-09: 5 mg via INTRAVENOUS
  Filled 2016-12-09: qty 10

## 2016-12-09 MED ORDER — METOPROLOL TARTRATE 5 MG/5ML IV SOLN
5.0000 mg | INTRAVENOUS | Status: DC | PRN
  Administered 2016-12-09 (×2): 5 mg via INTRAVENOUS

## 2016-12-09 MED ORDER — IOPAMIDOL (ISOVUE-370) INJECTION 76%
INTRAVENOUS | Status: AC
  Administered 2016-12-09: 80 mL via INTRAVENOUS
  Filled 2016-12-09: qty 100

## 2016-12-10 ENCOUNTER — Telehealth: Payer: Self-pay | Admitting: *Deleted

## 2016-12-10 DIAGNOSIS — Z79899 Other long term (current) drug therapy: Secondary | ICD-10-CM

## 2016-12-10 NOTE — Telephone Encounter (Signed)
See result note for CT.

## 2017-01-13 DIAGNOSIS — M9901 Segmental and somatic dysfunction of cervical region: Secondary | ICD-10-CM | POA: Diagnosis not present

## 2017-01-13 DIAGNOSIS — M5134 Other intervertebral disc degeneration, thoracic region: Secondary | ICD-10-CM | POA: Diagnosis not present

## 2017-01-13 DIAGNOSIS — M9902 Segmental and somatic dysfunction of thoracic region: Secondary | ICD-10-CM | POA: Diagnosis not present

## 2017-01-13 DIAGNOSIS — M50322 Other cervical disc degeneration at C5-C6 level: Secondary | ICD-10-CM | POA: Diagnosis not present

## 2017-01-14 DIAGNOSIS — N3010 Interstitial cystitis (chronic) without hematuria: Secondary | ICD-10-CM | POA: Diagnosis not present

## 2017-01-14 DIAGNOSIS — N952 Postmenopausal atrophic vaginitis: Secondary | ICD-10-CM | POA: Diagnosis not present

## 2017-01-14 DIAGNOSIS — N390 Urinary tract infection, site not specified: Secondary | ICD-10-CM | POA: Diagnosis not present

## 2017-01-18 DIAGNOSIS — M9901 Segmental and somatic dysfunction of cervical region: Secondary | ICD-10-CM | POA: Diagnosis not present

## 2017-01-18 DIAGNOSIS — M50322 Other cervical disc degeneration at C5-C6 level: Secondary | ICD-10-CM | POA: Diagnosis not present

## 2017-01-18 DIAGNOSIS — M9902 Segmental and somatic dysfunction of thoracic region: Secondary | ICD-10-CM | POA: Diagnosis not present

## 2017-01-18 DIAGNOSIS — M5134 Other intervertebral disc degeneration, thoracic region: Secondary | ICD-10-CM | POA: Diagnosis not present

## 2017-01-21 DIAGNOSIS — M9901 Segmental and somatic dysfunction of cervical region: Secondary | ICD-10-CM | POA: Diagnosis not present

## 2017-01-21 DIAGNOSIS — M9902 Segmental and somatic dysfunction of thoracic region: Secondary | ICD-10-CM | POA: Diagnosis not present

## 2017-01-21 DIAGNOSIS — M50322 Other cervical disc degeneration at C5-C6 level: Secondary | ICD-10-CM | POA: Diagnosis not present

## 2017-01-21 DIAGNOSIS — M5134 Other intervertebral disc degeneration, thoracic region: Secondary | ICD-10-CM | POA: Diagnosis not present

## 2017-01-26 DIAGNOSIS — M9902 Segmental and somatic dysfunction of thoracic region: Secondary | ICD-10-CM | POA: Diagnosis not present

## 2017-01-26 DIAGNOSIS — M9901 Segmental and somatic dysfunction of cervical region: Secondary | ICD-10-CM | POA: Diagnosis not present

## 2017-01-26 DIAGNOSIS — M50322 Other cervical disc degeneration at C5-C6 level: Secondary | ICD-10-CM | POA: Diagnosis not present

## 2017-01-26 DIAGNOSIS — M5134 Other intervertebral disc degeneration, thoracic region: Secondary | ICD-10-CM | POA: Diagnosis not present

## 2017-02-02 DIAGNOSIS — D2272 Melanocytic nevi of left lower limb, including hip: Secondary | ICD-10-CM | POA: Diagnosis not present

## 2017-02-02 DIAGNOSIS — M9901 Segmental and somatic dysfunction of cervical region: Secondary | ICD-10-CM | POA: Diagnosis not present

## 2017-02-02 DIAGNOSIS — L80 Vitiligo: Secondary | ICD-10-CM | POA: Diagnosis not present

## 2017-02-02 DIAGNOSIS — D2261 Melanocytic nevi of right upper limb, including shoulder: Secondary | ICD-10-CM | POA: Diagnosis not present

## 2017-02-02 DIAGNOSIS — M9902 Segmental and somatic dysfunction of thoracic region: Secondary | ICD-10-CM | POA: Diagnosis not present

## 2017-02-02 DIAGNOSIS — L821 Other seborrheic keratosis: Secondary | ICD-10-CM | POA: Diagnosis not present

## 2017-02-02 DIAGNOSIS — D1801 Hemangioma of skin and subcutaneous tissue: Secondary | ICD-10-CM | POA: Diagnosis not present

## 2017-02-02 DIAGNOSIS — M5134 Other intervertebral disc degeneration, thoracic region: Secondary | ICD-10-CM | POA: Diagnosis not present

## 2017-02-02 DIAGNOSIS — D225 Melanocytic nevi of trunk: Secondary | ICD-10-CM | POA: Diagnosis not present

## 2017-02-02 DIAGNOSIS — D2262 Melanocytic nevi of left upper limb, including shoulder: Secondary | ICD-10-CM | POA: Diagnosis not present

## 2017-02-02 DIAGNOSIS — L814 Other melanin hyperpigmentation: Secondary | ICD-10-CM | POA: Diagnosis not present

## 2017-02-02 DIAGNOSIS — M50322 Other cervical disc degeneration at C5-C6 level: Secondary | ICD-10-CM | POA: Diagnosis not present

## 2017-02-04 DIAGNOSIS — M9902 Segmental and somatic dysfunction of thoracic region: Secondary | ICD-10-CM | POA: Diagnosis not present

## 2017-02-04 DIAGNOSIS — M5134 Other intervertebral disc degeneration, thoracic region: Secondary | ICD-10-CM | POA: Diagnosis not present

## 2017-02-04 DIAGNOSIS — M9901 Segmental and somatic dysfunction of cervical region: Secondary | ICD-10-CM | POA: Diagnosis not present

## 2017-02-04 DIAGNOSIS — M50322 Other cervical disc degeneration at C5-C6 level: Secondary | ICD-10-CM | POA: Diagnosis not present

## 2017-02-09 DIAGNOSIS — M50322 Other cervical disc degeneration at C5-C6 level: Secondary | ICD-10-CM | POA: Diagnosis not present

## 2017-02-09 DIAGNOSIS — M5134 Other intervertebral disc degeneration, thoracic region: Secondary | ICD-10-CM | POA: Diagnosis not present

## 2017-02-09 DIAGNOSIS — M9901 Segmental and somatic dysfunction of cervical region: Secondary | ICD-10-CM | POA: Diagnosis not present

## 2017-02-09 DIAGNOSIS — M9902 Segmental and somatic dysfunction of thoracic region: Secondary | ICD-10-CM | POA: Diagnosis not present

## 2017-02-18 DIAGNOSIS — M9902 Segmental and somatic dysfunction of thoracic region: Secondary | ICD-10-CM | POA: Diagnosis not present

## 2017-02-18 DIAGNOSIS — M9901 Segmental and somatic dysfunction of cervical region: Secondary | ICD-10-CM | POA: Diagnosis not present

## 2017-02-18 DIAGNOSIS — M50322 Other cervical disc degeneration at C5-C6 level: Secondary | ICD-10-CM | POA: Diagnosis not present

## 2017-02-18 DIAGNOSIS — M5134 Other intervertebral disc degeneration, thoracic region: Secondary | ICD-10-CM | POA: Diagnosis not present

## 2017-03-01 DIAGNOSIS — M5134 Other intervertebral disc degeneration, thoracic region: Secondary | ICD-10-CM | POA: Diagnosis not present

## 2017-03-01 DIAGNOSIS — M9901 Segmental and somatic dysfunction of cervical region: Secondary | ICD-10-CM | POA: Diagnosis not present

## 2017-03-01 DIAGNOSIS — M9902 Segmental and somatic dysfunction of thoracic region: Secondary | ICD-10-CM | POA: Diagnosis not present

## 2017-03-01 DIAGNOSIS — Z6828 Body mass index (BMI) 28.0-28.9, adult: Secondary | ICD-10-CM | POA: Diagnosis not present

## 2017-03-01 DIAGNOSIS — M50322 Other cervical disc degeneration at C5-C6 level: Secondary | ICD-10-CM | POA: Diagnosis not present

## 2017-03-01 DIAGNOSIS — Z01419 Encounter for gynecological examination (general) (routine) without abnormal findings: Secondary | ICD-10-CM | POA: Diagnosis not present

## 2017-03-01 DIAGNOSIS — N8189 Other female genital prolapse: Secondary | ICD-10-CM | POA: Diagnosis not present

## 2017-03-15 DIAGNOSIS — M9901 Segmental and somatic dysfunction of cervical region: Secondary | ICD-10-CM | POA: Diagnosis not present

## 2017-03-15 DIAGNOSIS — M50322 Other cervical disc degeneration at C5-C6 level: Secondary | ICD-10-CM | POA: Diagnosis not present

## 2017-03-18 DIAGNOSIS — M4692 Unspecified inflammatory spondylopathy, cervical region: Secondary | ICD-10-CM | POA: Diagnosis not present

## 2017-03-18 DIAGNOSIS — M4722 Other spondylosis with radiculopathy, cervical region: Secondary | ICD-10-CM | POA: Diagnosis not present

## 2017-03-25 DIAGNOSIS — N958 Other specified menopausal and perimenopausal disorders: Secondary | ICD-10-CM | POA: Diagnosis not present

## 2017-03-25 DIAGNOSIS — E039 Hypothyroidism, unspecified: Secondary | ICD-10-CM | POA: Diagnosis not present

## 2017-04-19 DIAGNOSIS — M4722 Other spondylosis with radiculopathy, cervical region: Secondary | ICD-10-CM | POA: Diagnosis not present

## 2017-04-19 DIAGNOSIS — M542 Cervicalgia: Secondary | ICD-10-CM | POA: Diagnosis not present

## 2017-05-02 DIAGNOSIS — N390 Urinary tract infection, site not specified: Secondary | ICD-10-CM | POA: Diagnosis not present

## 2017-05-02 DIAGNOSIS — R82998 Other abnormal findings in urine: Secondary | ICD-10-CM | POA: Diagnosis not present

## 2017-05-03 ENCOUNTER — Other Ambulatory Visit: Payer: Self-pay

## 2017-05-03 DIAGNOSIS — M542 Cervicalgia: Secondary | ICD-10-CM

## 2017-05-12 ENCOUNTER — Other Ambulatory Visit: Payer: PPO

## 2017-05-24 ENCOUNTER — Ambulatory Visit
Admission: RE | Admit: 2017-05-24 | Discharge: 2017-05-24 | Disposition: A | Discharge status: Home / Self Care | Payer: PPO | Source: Ambulatory Visit | Attending: Orthopedic Surgery | Admitting: Orthopedic Surgery

## 2017-05-24 DIAGNOSIS — M4802 Spinal stenosis, cervical region: Secondary | ICD-10-CM | POA: Diagnosis not present

## 2017-05-24 DIAGNOSIS — M542 Cervicalgia: Secondary | ICD-10-CM

## 2017-05-31 DIAGNOSIS — M542 Cervicalgia: Secondary | ICD-10-CM | POA: Diagnosis not present

## 2017-05-31 DIAGNOSIS — M4722 Other spondylosis with radiculopathy, cervical region: Secondary | ICD-10-CM | POA: Diagnosis not present

## 2017-06-10 DIAGNOSIS — M5412 Radiculopathy, cervical region: Secondary | ICD-10-CM | POA: Diagnosis not present

## 2017-06-10 DIAGNOSIS — M9901 Segmental and somatic dysfunction of cervical region: Secondary | ICD-10-CM | POA: Diagnosis not present

## 2017-06-10 DIAGNOSIS — M50322 Other cervical disc degeneration at C5-C6 level: Secondary | ICD-10-CM | POA: Diagnosis not present

## 2017-06-20 DIAGNOSIS — H2513 Age-related nuclear cataract, bilateral: Secondary | ICD-10-CM | POA: Diagnosis not present

## 2017-06-20 DIAGNOSIS — H40053 Ocular hypertension, bilateral: Secondary | ICD-10-CM | POA: Diagnosis not present

## 2017-06-20 DIAGNOSIS — H40023 Open angle with borderline findings, high risk, bilateral: Secondary | ICD-10-CM | POA: Diagnosis not present

## 2017-06-20 DIAGNOSIS — H25013 Cortical age-related cataract, bilateral: Secondary | ICD-10-CM | POA: Diagnosis not present

## 2017-06-30 DIAGNOSIS — R300 Dysuria: Secondary | ICD-10-CM | POA: Diagnosis not present

## 2017-06-30 DIAGNOSIS — N390 Urinary tract infection, site not specified: Secondary | ICD-10-CM | POA: Diagnosis not present

## 2017-07-12 DIAGNOSIS — N3091 Cystitis, unspecified with hematuria: Secondary | ICD-10-CM | POA: Diagnosis not present

## 2017-07-14 ENCOUNTER — Other Ambulatory Visit: Payer: Self-pay

## 2017-07-14 ENCOUNTER — Encounter (HOSPITAL_COMMUNITY): Payer: Self-pay

## 2017-07-14 ENCOUNTER — Emergency Department (HOSPITAL_COMMUNITY): Payer: PPO

## 2017-07-14 ENCOUNTER — Emergency Department (HOSPITAL_COMMUNITY)
Admission: EM | Admit: 2017-07-14 | Discharge: 2017-07-14 | Disposition: A | Discharge status: Home / Self Care | Payer: PPO | Attending: Emergency Medicine | Admitting: Emergency Medicine

## 2017-07-14 DIAGNOSIS — R5383 Other fatigue: Secondary | ICD-10-CM | POA: Diagnosis not present

## 2017-07-14 DIAGNOSIS — I10 Essential (primary) hypertension: Secondary | ICD-10-CM | POA: Insufficient documentation

## 2017-07-14 DIAGNOSIS — R200 Anesthesia of skin: Secondary | ICD-10-CM | POA: Diagnosis not present

## 2017-07-14 DIAGNOSIS — E119 Type 2 diabetes mellitus without complications: Secondary | ICD-10-CM | POA: Diagnosis not present

## 2017-07-14 DIAGNOSIS — Z87891 Personal history of nicotine dependence: Secondary | ICD-10-CM | POA: Diagnosis not present

## 2017-07-14 DIAGNOSIS — R202 Paresthesia of skin: Secondary | ICD-10-CM | POA: Diagnosis not present

## 2017-07-14 DIAGNOSIS — Z79899 Other long term (current) drug therapy: Secondary | ICD-10-CM | POA: Insufficient documentation

## 2017-07-14 DIAGNOSIS — R5381 Other malaise: Secondary | ICD-10-CM | POA: Diagnosis not present

## 2017-07-14 DIAGNOSIS — Z7982 Long term (current) use of aspirin: Secondary | ICD-10-CM | POA: Diagnosis not present

## 2017-07-14 DIAGNOSIS — R002 Palpitations: Secondary | ICD-10-CM | POA: Diagnosis not present

## 2017-07-14 LAB — CBC WITH DIFFERENTIAL/PLATELET
ABS IMMATURE GRANULOCYTES: 0 10*3/uL (ref 0.0–0.1)
BASOS ABS: 0 10*3/uL (ref 0.0–0.1)
BASOS PCT: 0 %
EOS PCT: 1 %
Eosinophils Absolute: 0.1 10*3/uL (ref 0.0–0.7)
HCT: 41 % (ref 36.0–46.0)
HEMOGLOBIN: 13.4 g/dL (ref 12.0–15.0)
Immature Granulocytes: 0 %
Lymphocytes Relative: 19 %
Lymphs Abs: 1.1 10*3/uL (ref 0.7–4.0)
MCH: 30.7 pg (ref 26.0–34.0)
MCHC: 32.7 g/dL (ref 30.0–36.0)
MCV: 94 fL (ref 78.0–100.0)
MONO ABS: 0.4 10*3/uL (ref 0.1–1.0)
Monocytes Relative: 8 %
NEUTROS ABS: 4.1 10*3/uL (ref 1.7–7.7)
Neutrophils Relative %: 72 %
PLATELETS: 233 10*3/uL (ref 150–400)
RBC: 4.36 MIL/uL (ref 3.87–5.11)
RDW: 12.2 % (ref 11.5–15.5)
WBC: 5.7 10*3/uL (ref 4.0–10.5)

## 2017-07-14 LAB — COMPREHENSIVE METABOLIC PANEL
ALBUMIN: 3.5 g/dL (ref 3.5–5.0)
ALK PHOS: 80 U/L (ref 38–126)
ALT: 21 U/L (ref 0–44)
AST: 22 U/L (ref 15–41)
Anion gap: 6 (ref 5–15)
BILIRUBIN TOTAL: 0.9 mg/dL (ref 0.3–1.2)
BUN: 10 mg/dL (ref 8–23)
CALCIUM: 9.2 mg/dL (ref 8.9–10.3)
CO2: 27 mmol/L (ref 22–32)
Chloride: 106 mmol/L (ref 98–111)
Creatinine, Ser: 0.66 mg/dL (ref 0.44–1.00)
GFR calc Af Amer: >60 mL/min (ref >60)
GFR calc non Af Amer: >60 mL/min (ref >60)
GLUCOSE: 114 mg/dL — AB (ref 70–99)
Potassium: 4.8 mmol/L (ref 3.5–5.1)
SODIUM: 139 mmol/L (ref 135–145)
TOTAL PROTEIN: 6.2 g/dL — AB (ref 6.5–8.1)

## 2017-07-14 LAB — URINALYSIS, ROUTINE W REFLEX MICROSCOPIC
Bacteria, UA: NONE SEEN
Bilirubin Urine: NEGATIVE
Glucose, UA: NEGATIVE mg/dL
Hgb urine dipstick: NEGATIVE
Ketones, ur: NEGATIVE mg/dL
LEUKOCYTES UA: NEGATIVE
NITRITE: NEGATIVE
PH: 6 (ref 5.0–8.0)
Protein, ur: NEGATIVE mg/dL
SPECIFIC GRAVITY, URINE: 1.002 — AB (ref 1.005–1.030)

## 2017-07-14 LAB — I-STAT TROPONIN, ED: TROPONIN I, POC: 0 ng/mL (ref 0.00–0.08)

## 2017-07-14 MED ORDER — SODIUM CHLORIDE 0.9 % IV BOLUS
500.0000 mL | Freq: Once | INTRAVENOUS | Status: AC
  Administered 2017-07-14: 500 mL via INTRAVENOUS

## 2017-07-14 NOTE — ED Provider Notes (Signed)
[facility] EMERGENCY DEPARTMENT Provider Note   CSN: [id number] Arrival date & time: 07/14/17  0753     History   Chief Complaint Chief Complaint  Patient presents with  . Numbness    HPI Cathy Page is a 67 y.o. female.  The history is provided by the patient. No language interpreter was used.   Cathy Page is a 67 y.o. female who presents to the Emergency Department complaining of numbness. She presents to the emergency department for feeling numb throughout her face as well as weakness in her shoulders and feeling poorly for the last 3 to 4 days. She attributes her symptoms to taking Prelief for interstitial cystitis about a week ago. She has numbness in bilateral cheeks that is waxing and waning and worse with standing. She feels generalized weakness but greatest in her shoulders as well as malaise and feeling unwell. Five days ago she did have vomiting and diarrhea, this is now resolved. She does endorse a mild abdominal discomfort as well as low back pain. No reports of fevers, chest pain, shortness of breath. She also endorses increased stress recently. She does not take any hormone medications. No history of TIA/CVA. She is a non-smoker, occasional alcohol, no drug use. A week ago she completed a course of Keflex for UTI. Past Medical History:  Diagnosis Date  . Anxiety   . Bilateral renal cysts    simple  . Diverticulosis   . Endometrial polyp   . Fibromyalgia   . GERD (gastroesophageal reflux disease)   . History of palpitations    cardiologist consult 2013 w/ dr Tressia Miners turner-- felt to be due to increasing thyroid medication on her own  . Hx of leukocytosis 11/30/2012  . Hyperlipidemia   . Hypertension   . Interstitial cystitis    Chronic, intermittent  . Pre-diabetes   . Type 2 diabetes mellitus ([place])   . Wears partial dentures    lower    Patient Active Problem List   Diagnosis Date Noted  . Chest pain, rule out acute myocardial  infarction 01/20/2016  . Hx of leukocytosis 11/30/2012  . Elevated liver enzymes 11/30/2012  . Chest pain 11/23/2012  . Palpitation 11/23/2012  . Hyperlipidemia   . Hypertension   . Diabetes mellitus without complication [facility])     Past Surgical History:  Procedure Laterality Date  . CARDIOVASCULAR STRESS TEST  01/21/2016   normal nuclear study w/ no ischemia/  normal LV function and wall motion , stress ef 74%  . CESAREAN SECTION  x3  last one 1980   BILATERAL TUBAL LIGATION  W/ LAST C/S  . COLONOSCOPY  last one 2014  . D & C  FAILED HYSTEROSCOPY  04-23-2003  dr Bing Matter   severe anteverted uterus  . DILATATION & CURETTAGE/HYSTEROSCOPY WITH MYOSURE N/A 01/27/2016   Procedure: DILATATION & CURETTAGE/HYSTEROSCOPY WITH MYOSURE AND ENDOMETRIAL CURETTAGE;  Surgeon: Tyson Dense, MD;  Location: [facility];  Service: Gynecology;  Laterality: N/A;  . EXPLORATORY LAPAROTOMY/  OVARIAN CYSTECTOMY  1976   w/  APPENDECTOMY     OB History   None      Home Medications    Prior to Admission medications   Medication Sig Start Date End Date Taking? Authorizing Provider  ALPRAZolam Duanne Moron) 1 MG tablet Take 0.5 mg by mouth 2 (two) times daily as needed for anxiety.    [provider]  aspirin 81 MG chewable tablet Chew 81 mg by mouth daily.  [provider]  Cholecalciferol (VITAMIN D-3) 1000 UNITS CAPS Take 3,000 Units by mouth daily.    [provider]  Coenzyme Q10 (CO Q 10 PO) Take 1 tablet by mouth daily.    [provider]  Cyanocobalamin (B-12) 5000 MCG SUBL Place under the tongue daily.    [provider]  diphenhydrAMINE (BENADRYL) 25 mg capsule Take 25 mg by mouth daily as needed for allergies.    [provider]  fish oil-omega-3 fatty acids 1000 MG capsule Take 2 g by mouth daily.    [provider]  levothyroxine (SYNTHROID, LEVOTHROID) 50 MCG tablet Take 1 tablet daily by mouth. 11/17/16    [provider]  liothyronine (CYTOMEL) 5 MCG tablet Take 10 mcg by mouth 2 (two) times daily.     [provider]  lisinopril (PRINIVIL,ZESTRIL) 20 MG tablet Take 10 mg by mouth every morning.    [provider]  Magnesium 500 MG CAPS Take 1 capsule by mouth daily.    [provider]  Multiple Vitamins-Minerals (MULTIVITAMIN PO) Take 1 tablet by mouth daily.    [provider]  Probiotic Product (PROBIOTIC DAILY) CAPS Take by mouth daily.    [provider]    Family History Family History  Problem Relation Age of Onset  . Diabetes Father   . CAD Mother     Social History Social History   Tobacco Use  . Smoking status: Former Smoker    Packs/day: 1.00    Years: 12.00    Pack years: 12.00    Types: Cigarettes    Last attempt to quit: 01/12/1983    Years since quitting: 34.5  . Smokeless tobacco: Never Used  Substance Use Topics  . Alcohol use: Yes    Comment: OCCASIONAL  . Drug use: No     Allergies   Sulfa antibiotics; Codeine; and Hydrocodone   Review of Systems Review of Systems  All other systems reviewed and are negative.    Physical Exam Updated Vital Signs BP 137/88   Pulse 71   Temp 98.1 F (36.7 C) (Oral)   Resp 16   Ht 5\' 4"  (1.626 m)   Wt 72.6 kg (160 lb)   SpO2 98%   BMI 27.46 kg/m   Physical Exam  Constitutional: She is oriented to person, place, and time. She appears well-developed and well-nourished.  HENT:  Head: Normocephalic and atraumatic.  Mouth/Throat: Oropharynx is clear and moist.  Eyes: Pupils are equal, round, and reactive to light. EOM are normal.  Neck: Neck supple.  Cardiovascular: Normal rate and regular rhythm.  No murmur heard. Pulmonary/Chest: Effort normal and breath sounds normal. No respiratory distress.  Abdominal: Soft. There is no tenderness. There is no rebound and no guarding.  Musculoskeletal: She exhibits no edema or tenderness.  Neurological: She is alert  and oriented to person, place, and time.  Five out of five strength in all four extremities with sensation to light touch intact in all four extremities. Negative Romberg. Cranial nerves grossly intact.  Skin: Skin is warm and dry.  Psychiatric: Her behavior is normal.  Mildly anxious  Nursing note and vitals reviewed.    ED Treatments / Results  Labs (all labs ordered are listed, but only abnormal results are displayed) Labs Reviewed  COMPREHENSIVE METABOLIC PANEL - Abnormal; Notable for the following components:      Result Value   Glucose, Bld 114 (*)    Total Protein 6.2 (*)    All  other components within normal limits  URINALYSIS, ROUTINE W REFLEX MICROSCOPIC - Abnormal; Notable for the following components:   Color, Urine COLORLESS (*)    Specific Gravity, Urine 1.002 (*)    All other components within normal limits  URINE CULTURE  CBC WITH DIFFERENTIAL/PLATELET  I-STAT TROPONIN, ED    EKG EKG Interpretation  Date/Time:  Thursday July 14 2017 08:13:27 EDT Ventricular Rate:  78 PR Interval:    QRS Duration: 92 QT Interval:  369 QTC Calculation: 421 R Axis:   79 Text Interpretation:  Sinus rhythm Confirmed by Quintella Reichert [phone]) on 07/14/2017 8:29:45 AM   Radiology Ct Head Wo Contrast  Result Date: 07/14/2017 CLINICAL DATA:  Facial numbness EXAM: CT HEAD WITHOUT CONTRAST TECHNIQUE: Contiguous axial images were obtained from the base of the skull through the vertex without intravenous contrast. COMPARISON:  None. FINDINGS: Brain: No evidence of acute infarction, hemorrhage, hydrocephalus, extra-axial collection or mass lesion/mass effect. Vascular: No hyperdense vessel or unexpected calcification. Skull: Normal. Negative for fracture or focal lesion. Sinuses/Orbits: No acute finding. Other: None. IMPRESSION: No acute intracranial abnormality noted. Electronically Signed   By: Inez Catalina M.D.   On: 07/14/2017 09:00    Procedures Procedures (including critical care  time)  Medications Ordered in ED Medications  sodium chloride 0.9 % bolus 500 mL (0 mLs Intravenous Stopped 07/14/17 1103)     Initial Impression / Assessment and Plan / ED Course  I have reviewed the triage vital signs and the nursing notes.  Pertinent labs & imaging results that were available during my care of the patient were reviewed by me and considered in my medical decision making (see chart for details).     Patient here for evaluation of several days of bilateral facial numbness as well as bilateral upper extremity weakness. She is non-toxic appearing on examination with no focal neurologic deficits. Presentation is not consistent with CVA, ACS, PE. No evidence of anemia, electrolyte abnormality or UTI. She is mildly orthostatic on standing. She was treated with IV fluids. Discussed with patient unclear source of her symptoms. Plan to discharge home with close outpatient follow-up as well as return precautions.  Final Clinical Impressions(s) / ED Diagnoses   Final diagnoses:  Paresthesia  Malaise and fatigue    ED Discharge Orders    None       Quintella Reichert, MD 07/14/17 1354

## 2017-07-14 NOTE — ED Notes (Signed)
Pt going to Ct Scan at this time

## 2017-07-14 NOTE — Discharge Instructions (Addendum)
Stop taking the Prelief, it may be contributing to your symptoms.  Drink plenty of fluids.  Get rechecked immediately if you have any new or concerning symptoms.

## 2017-07-14 NOTE — ED Triage Notes (Addendum)
Pt reports feeling numbness to face and tingling to the right arm for the  " couple of days " pt reports " not feeling right since I started to take Prelief Supplementary " pt also reports feeling generalized weakness in her back ; pt states " I read the side effects of the medicine and I'm just not sure if its causing these symptoms " " ive also been under a lot of stress lately"  Pt also reports having a lower back pain  X 1  week ; denies injury

## 2017-07-15 LAB — URINE CULTURE: Culture: <10000 — AB

## 2017-09-16 DIAGNOSIS — H43393 Other vitreous opacities, bilateral: Secondary | ICD-10-CM | POA: Diagnosis not present

## 2017-09-16 DIAGNOSIS — H2513 Age-related nuclear cataract, bilateral: Secondary | ICD-10-CM | POA: Diagnosis not present

## 2017-09-16 DIAGNOSIS — H43813 Vitreous degeneration, bilateral: Secondary | ICD-10-CM | POA: Diagnosis not present

## 2017-10-13 DIAGNOSIS — K5790 Diverticulosis of intestine, part unspecified, without perforation or abscess without bleeding: Secondary | ICD-10-CM | POA: Diagnosis not present

## 2017-10-13 DIAGNOSIS — R1084 Generalized abdominal pain: Secondary | ICD-10-CM | POA: Diagnosis not present

## 2017-10-14 DIAGNOSIS — M5136 Other intervertebral disc degeneration, lumbar region: Secondary | ICD-10-CM | POA: Diagnosis not present

## 2017-10-14 DIAGNOSIS — M9905 Segmental and somatic dysfunction of pelvic region: Secondary | ICD-10-CM | POA: Diagnosis not present

## 2017-10-14 DIAGNOSIS — M9903 Segmental and somatic dysfunction of lumbar region: Secondary | ICD-10-CM | POA: Diagnosis not present

## 2017-10-14 DIAGNOSIS — M9904 Segmental and somatic dysfunction of sacral region: Secondary | ICD-10-CM | POA: Diagnosis not present

## 2017-10-17 DIAGNOSIS — M9903 Segmental and somatic dysfunction of lumbar region: Secondary | ICD-10-CM | POA: Diagnosis not present

## 2017-10-17 DIAGNOSIS — M5136 Other intervertebral disc degeneration, lumbar region: Secondary | ICD-10-CM | POA: Diagnosis not present

## 2017-10-17 DIAGNOSIS — M9905 Segmental and somatic dysfunction of pelvic region: Secondary | ICD-10-CM | POA: Diagnosis not present

## 2017-10-17 DIAGNOSIS — M9904 Segmental and somatic dysfunction of sacral region: Secondary | ICD-10-CM | POA: Diagnosis not present

## 2017-10-24 DIAGNOSIS — M9905 Segmental and somatic dysfunction of pelvic region: Secondary | ICD-10-CM | POA: Diagnosis not present

## 2017-10-24 DIAGNOSIS — M9903 Segmental and somatic dysfunction of lumbar region: Secondary | ICD-10-CM | POA: Diagnosis not present

## 2017-10-24 DIAGNOSIS — M9904 Segmental and somatic dysfunction of sacral region: Secondary | ICD-10-CM | POA: Diagnosis not present

## 2017-10-24 DIAGNOSIS — M5136 Other intervertebral disc degeneration, lumbar region: Secondary | ICD-10-CM | POA: Diagnosis not present

## 2017-10-25 DIAGNOSIS — M5412 Radiculopathy, cervical region: Secondary | ICD-10-CM | POA: Diagnosis not present

## 2017-10-26 DIAGNOSIS — M5412 Radiculopathy, cervical region: Secondary | ICD-10-CM | POA: Diagnosis not present

## 2017-10-26 DIAGNOSIS — H2513 Age-related nuclear cataract, bilateral: Secondary | ICD-10-CM | POA: Diagnosis not present

## 2017-10-26 DIAGNOSIS — H40023 Open angle with borderline findings, high risk, bilateral: Secondary | ICD-10-CM | POA: Diagnosis not present

## 2017-10-26 DIAGNOSIS — H25013 Cortical age-related cataract, bilateral: Secondary | ICD-10-CM | POA: Diagnosis not present

## 2017-10-26 DIAGNOSIS — H40053 Ocular hypertension, bilateral: Secondary | ICD-10-CM | POA: Diagnosis not present

## 2017-10-28 DIAGNOSIS — M5412 Radiculopathy, cervical region: Secondary | ICD-10-CM | POA: Diagnosis not present

## 2017-10-31 DIAGNOSIS — M5412 Radiculopathy, cervical region: Secondary | ICD-10-CM | POA: Diagnosis not present

## 2017-11-02 DIAGNOSIS — K625 Hemorrhage of anus and rectum: Secondary | ICD-10-CM | POA: Diagnosis not present

## 2017-11-02 DIAGNOSIS — K644 Residual hemorrhoidal skin tags: Secondary | ICD-10-CM | POA: Diagnosis not present

## 2017-11-04 DIAGNOSIS — M5412 Radiculopathy, cervical region: Secondary | ICD-10-CM | POA: Diagnosis not present

## 2017-11-09 DIAGNOSIS — M5412 Radiculopathy, cervical region: Secondary | ICD-10-CM | POA: Diagnosis not present

## 2017-11-11 DIAGNOSIS — K625 Hemorrhage of anus and rectum: Secondary | ICD-10-CM | POA: Diagnosis not present

## 2017-11-11 DIAGNOSIS — K648 Other hemorrhoids: Secondary | ICD-10-CM | POA: Diagnosis not present

## 2017-11-11 DIAGNOSIS — K621 Rectal polyp: Secondary | ICD-10-CM | POA: Diagnosis not present

## 2017-11-11 DIAGNOSIS — K5730 Diverticulosis of large intestine without perforation or abscess without bleeding: Secondary | ICD-10-CM | POA: Diagnosis not present

## 2017-11-14 DIAGNOSIS — M5412 Radiculopathy, cervical region: Secondary | ICD-10-CM | POA: Diagnosis not present

## 2017-11-15 DIAGNOSIS — K621 Rectal polyp: Secondary | ICD-10-CM | POA: Diagnosis not present

## 2017-11-17 DIAGNOSIS — R82998 Other abnormal findings in urine: Secondary | ICD-10-CM | POA: Diagnosis not present

## 2017-11-17 DIAGNOSIS — N390 Urinary tract infection, site not specified: Secondary | ICD-10-CM | POA: Diagnosis not present

## 2017-11-17 DIAGNOSIS — M5412 Radiculopathy, cervical region: Secondary | ICD-10-CM | POA: Diagnosis not present

## 2017-11-22 DIAGNOSIS — M5412 Radiculopathy, cervical region: Secondary | ICD-10-CM | POA: Diagnosis not present

## 2017-11-24 DIAGNOSIS — M5412 Radiculopathy, cervical region: Secondary | ICD-10-CM | POA: Diagnosis not present

## 2017-11-29 DIAGNOSIS — M5412 Radiculopathy, cervical region: Secondary | ICD-10-CM | POA: Diagnosis not present

## 2017-12-02 DIAGNOSIS — M5412 Radiculopathy, cervical region: Secondary | ICD-10-CM | POA: Diagnosis not present

## 2017-12-06 DIAGNOSIS — M5412 Radiculopathy, cervical region: Secondary | ICD-10-CM | POA: Diagnosis not present

## 2017-12-15 DIAGNOSIS — M5412 Radiculopathy, cervical region: Secondary | ICD-10-CM | POA: Diagnosis not present

## 2017-12-20 DIAGNOSIS — H18413 Arcus senilis, bilateral: Secondary | ICD-10-CM | POA: Diagnosis not present

## 2017-12-20 DIAGNOSIS — H2513 Age-related nuclear cataract, bilateral: Secondary | ICD-10-CM | POA: Diagnosis not present

## 2017-12-20 DIAGNOSIS — H25043 Posterior subcapsular polar age-related cataract, bilateral: Secondary | ICD-10-CM | POA: Diagnosis not present

## 2017-12-20 DIAGNOSIS — M5412 Radiculopathy, cervical region: Secondary | ICD-10-CM | POA: Diagnosis not present

## 2017-12-20 DIAGNOSIS — H25013 Cortical age-related cataract, bilateral: Secondary | ICD-10-CM | POA: Diagnosis not present

## 2017-12-23 DIAGNOSIS — M5412 Radiculopathy, cervical region: Secondary | ICD-10-CM | POA: Diagnosis not present

## 2017-12-26 DIAGNOSIS — M5412 Radiculopathy, cervical region: Secondary | ICD-10-CM | POA: Diagnosis not present

## 2017-12-30 DIAGNOSIS — M5412 Radiculopathy, cervical region: Secondary | ICD-10-CM | POA: Diagnosis not present

## 2018-01-06 DIAGNOSIS — D225 Melanocytic nevi of trunk: Secondary | ICD-10-CM | POA: Diagnosis not present

## 2018-01-06 DIAGNOSIS — D1801 Hemangioma of skin and subcutaneous tissue: Secondary | ICD-10-CM | POA: Diagnosis not present

## 2018-01-06 DIAGNOSIS — L814 Other melanin hyperpigmentation: Secondary | ICD-10-CM | POA: Diagnosis not present

## 2018-01-06 DIAGNOSIS — D2262 Melanocytic nevi of left upper limb, including shoulder: Secondary | ICD-10-CM | POA: Diagnosis not present

## 2018-01-06 DIAGNOSIS — M5412 Radiculopathy, cervical region: Secondary | ICD-10-CM | POA: Diagnosis not present

## 2018-01-06 DIAGNOSIS — D2261 Melanocytic nevi of right upper limb, including shoulder: Secondary | ICD-10-CM | POA: Diagnosis not present

## 2018-01-06 DIAGNOSIS — D2272 Melanocytic nevi of left lower limb, including hip: Secondary | ICD-10-CM | POA: Diagnosis not present

## 2018-01-06 DIAGNOSIS — L720 Epidermal cyst: Secondary | ICD-10-CM | POA: Diagnosis not present

## 2018-01-06 DIAGNOSIS — L821 Other seborrheic keratosis: Secondary | ICD-10-CM | POA: Diagnosis not present

## 2018-01-06 DIAGNOSIS — L80 Vitiligo: Secondary | ICD-10-CM | POA: Diagnosis not present

## 2018-01-06 DIAGNOSIS — L738 Other specified follicular disorders: Secondary | ICD-10-CM | POA: Diagnosis not present

## 2018-01-18 IMAGING — CT CT HEART MORP W/ CTA COR W/ SCORE W/ CA W/CM &/OR W/O CM
4 of 7 series · 8 of 20 positions shown, 9 images · non-contrast
Comparison: None.

CLINICAL DATA: 66-year-old male with atypical chest pain and family
h/o premature CAD.

EXAM:
Cardiac/Coronary  CT
TECHNIQUE: The patient was scanned on a [employer] Force scanner.

[Series 6: best diast 71 % · axial · 0.33mm/px · z∈[+1152,+1203]mm · 2 of 382 slices shown, 3 images]
[im 128/382  vessel]
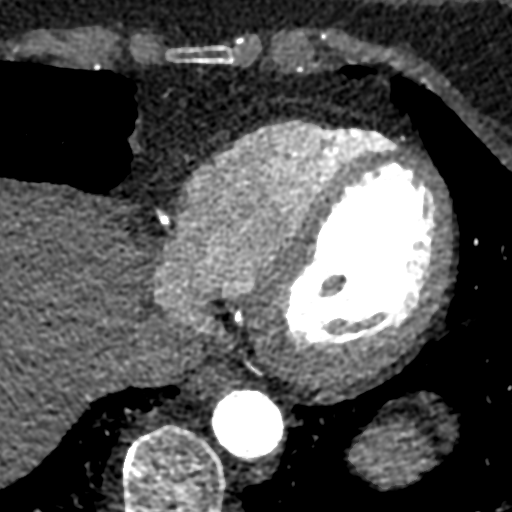
[im 128/382  lung]
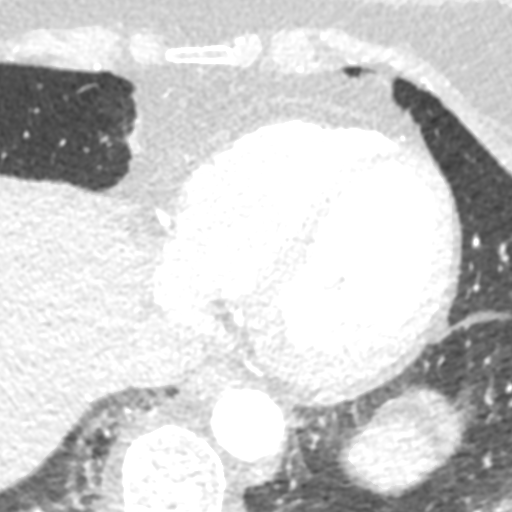
[im 255/382  vessel]
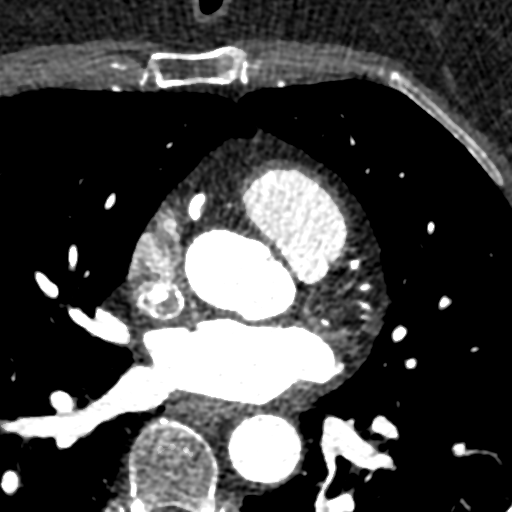

[Series 7: best syst 42 % · axial · 0.33mm/px · z∈[+1152,+1203]mm · 2 of 382 slices shown]
[im 128/382  vessel]
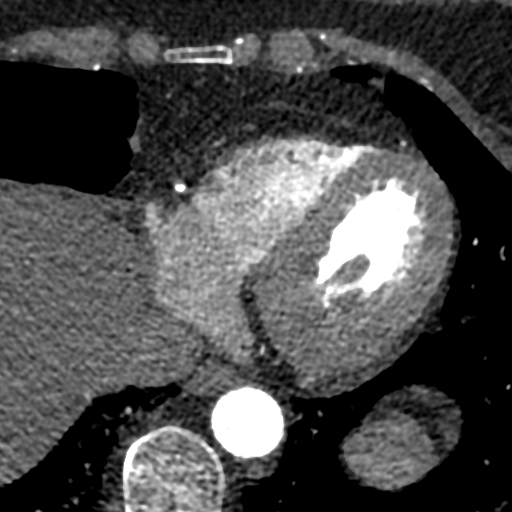
[im 255/382  vessel]
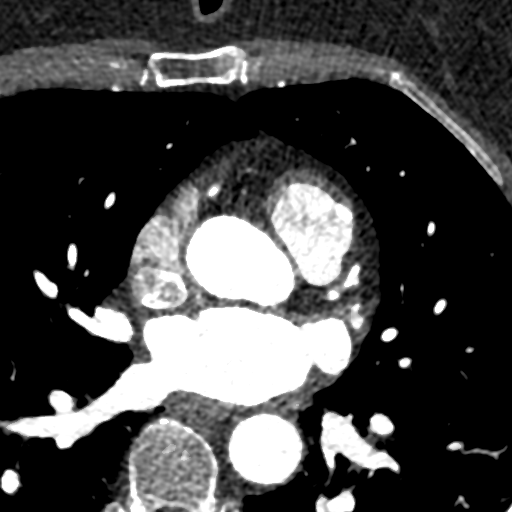

[Series 8: ts diast sharp 71 % · axial · 0.33mm/px · z∈[+1152,+1203]mm · 2 of 382 slices shown]
[im 128/382  lung]
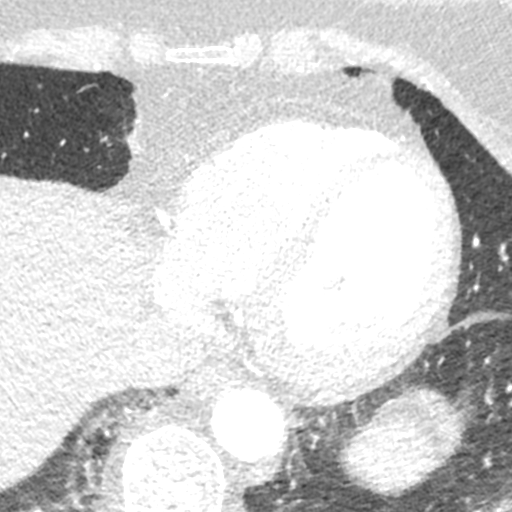
[im 255/382  lung]
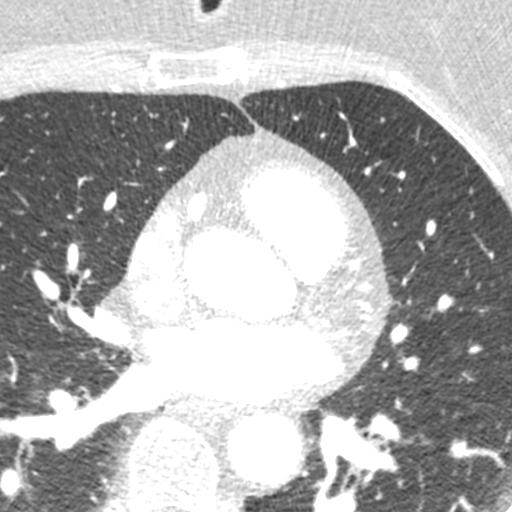

[Series 9: ts syst sharp 42 % · axial · 0.33mm/px · z∈[+1152,+1203]mm · 2 of 382 slices shown]
[im 128/382  lung]
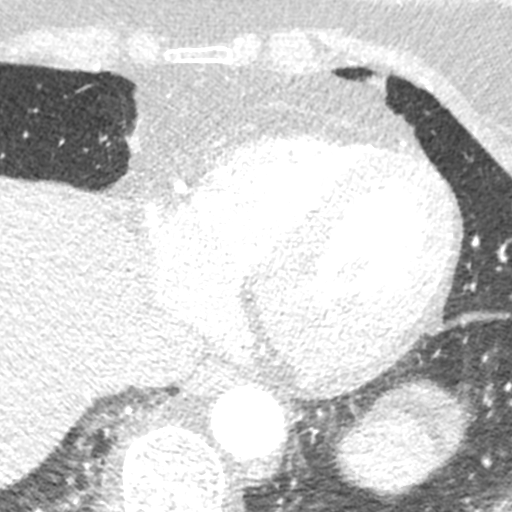
[im 255/382  lung]
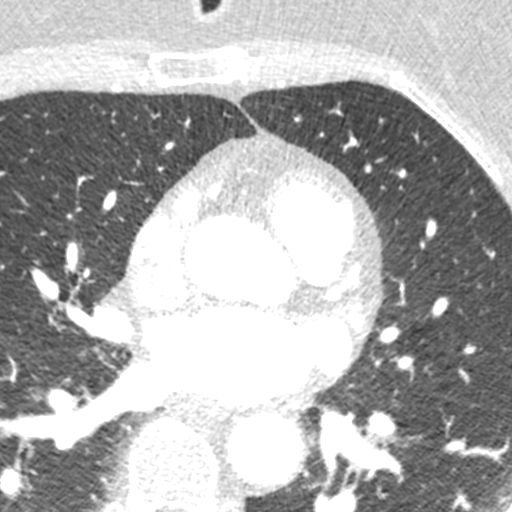

[8 of 20 positions shown; findings below may reference images not displayed]

FINDINGS: A 120 kV prospective scan was triggered in the descending thoracic
aorta at 111 HU's. Axial non-contrast 3 mm slices were carried out
through the heart. The data set was analyzed on a dedicated work
station and scored using the Agatson method. Gantry rotation speed
was 250 msecs and collimation was .6 mm. 5 mg of iv Metoprolol and
0.8 mg of sl NTG was given. The 3D data set was reconstructed in 5%
intervals of the 67-82 % of the R-R cycle. Diastolic phases were
analyzed on a dedicated work station using MPR, MIP and VRT modes.
The patient received 80 cc of contrast.

Aorta:  Normal size.  No calcifications.  No dissection.

Aortic Valve:  Trileaflet.  No calcifications.

Coronary Arteries:  Normal coronary origin.  Right dominance.

RCA is a large dominant artery that gives rise to PDA and PLVB. Mid
RCA has a mild calcified plaque with associated stenosis 25-50%.

Left main is a large artery that gives rise to LAD, ramus
intermedius and LCX arteries. There is an eccentric calcified plaque
at the left main ostium associated with 0-25% stenosis.

LAD is a large vessel that gives rise to one diagonal branch and has
no plaque.

RI is a small artery that has no plaque.

LCX is a very small non-dominant artery that has no plaque.

Other findings:

Normal pulmonary vein drainage into the left atrium.

Normal let atrial appendage without a thrombus.

Normal size of the pulmonary artery.
IMPRESSION: 1. Coronary calcium score of 116. This was 78 percentile for age and
sex matched control.

2. Normal coronary origin with right dominance.

3. Mild non-obstructive CAD. Risk factor modification is
recommended.

EXAM:
OVER-READ INTERPRETATION  CT CHEST

The following report is an over-read performed by radiologist Dr.
Manoel Lemoine [REDACTED] on 12/09/2016. This over-read
does not include interpretation of cardiac or coronary anatomy or
pathology. The coronary CTA interpretation by the cardiologist is
attached.
FINDINGS: Vascular: Heart is normal size.  Aorta is normal caliber.

Mediastinum/Nodes: No adenopathy in the lower mediastinum or hila.

Lungs/Pleura: No confluent airspace opacities or effusions.

Upper Abdomen: Suspect scratched at fatty infiltration of the liver.
No acute findings in the upper abdomen.

Musculoskeletal: Chest wall soft tissues are unremarkable. No acute
bony abnormality.
IMPRESSION: Fatty infiltration of the liver.

No acute extra cardiac abnormality.

## 2018-01-23 DIAGNOSIS — M5412 Radiculopathy, cervical region: Secondary | ICD-10-CM | POA: Diagnosis not present

## 2018-02-09 DIAGNOSIS — M50322 Other cervical disc degeneration at C5-C6 level: Secondary | ICD-10-CM | POA: Diagnosis not present

## 2018-02-09 DIAGNOSIS — M9901 Segmental and somatic dysfunction of cervical region: Secondary | ICD-10-CM | POA: Diagnosis not present

## 2018-03-13 ENCOUNTER — Other Ambulatory Visit: Payer: Self-pay | Admitting: Gastroenterology

## 2018-03-13 ENCOUNTER — Other Ambulatory Visit (HOSPITAL_COMMUNITY): Payer: Self-pay | Admitting: Gastroenterology

## 2018-03-13 DIAGNOSIS — R1010 Upper abdominal pain, unspecified: Secondary | ICD-10-CM

## 2018-03-20 ENCOUNTER — Ambulatory Visit (HOSPITAL_COMMUNITY): Payer: Medicare Other

## 2018-03-20 ENCOUNTER — Encounter (HOSPITAL_COMMUNITY): Payer: Self-pay

## 2018-06-21 DIAGNOSIS — N809 Endometriosis, unspecified: Secondary | ICD-10-CM | POA: Insufficient documentation

## 2018-06-21 DIAGNOSIS — N390 Urinary tract infection, site not specified: Secondary | ICD-10-CM | POA: Insufficient documentation

## 2018-06-21 DIAGNOSIS — N819 Female genital prolapse, unspecified: Secondary | ICD-10-CM | POA: Insufficient documentation

## 2018-09-14 ENCOUNTER — Other Ambulatory Visit: Payer: Self-pay | Admitting: Family Medicine

## 2018-09-14 DIAGNOSIS — R1011 Right upper quadrant pain: Secondary | ICD-10-CM

## 2018-09-14 DIAGNOSIS — N281 Cyst of kidney, acquired: Secondary | ICD-10-CM

## 2018-09-14 DIAGNOSIS — K7689 Other specified diseases of liver: Secondary | ICD-10-CM

## 2018-09-22 ENCOUNTER — Ambulatory Visit
Admission: RE | Admit: 2018-09-22 | Discharge: 2018-09-22 | Disposition: A | Discharge status: Home / Self Care | Payer: Medicare Other | Source: Ambulatory Visit | Attending: Family Medicine | Admitting: Family Medicine

## 2018-09-22 DIAGNOSIS — K7689 Other specified diseases of liver: Secondary | ICD-10-CM

## 2018-09-22 DIAGNOSIS — N281 Cyst of kidney, acquired: Secondary | ICD-10-CM

## 2018-09-22 DIAGNOSIS — R1011 Right upper quadrant pain: Secondary | ICD-10-CM

## 2019-02-06 ENCOUNTER — Emergency Department (HOSPITAL_COMMUNITY)
Admission: EM | Admit: 2019-02-06 | Discharge: 2019-02-06 | Disposition: A | Discharge status: Home / Self Care | Payer: Medicare Other | Attending: Emergency Medicine | Admitting: Emergency Medicine

## 2019-02-06 ENCOUNTER — Other Ambulatory Visit: Payer: Self-pay

## 2019-02-06 DIAGNOSIS — R030 Elevated blood-pressure reading, without diagnosis of hypertension: Secondary | ICD-10-CM | POA: Diagnosis not present

## 2019-02-06 DIAGNOSIS — R42 Dizziness and giddiness: Secondary | ICD-10-CM | POA: Diagnosis not present

## 2019-02-06 DIAGNOSIS — Z87891 Personal history of nicotine dependence: Secondary | ICD-10-CM | POA: Insufficient documentation

## 2019-02-06 DIAGNOSIS — E119 Type 2 diabetes mellitus without complications: Secondary | ICD-10-CM | POA: Insufficient documentation

## 2019-02-06 DIAGNOSIS — Z79899 Other long term (current) drug therapy: Secondary | ICD-10-CM | POA: Insufficient documentation

## 2019-02-06 DIAGNOSIS — R519 Headache, unspecified: Secondary | ICD-10-CM | POA: Diagnosis present

## 2019-02-06 LAB — CBC WITH DIFFERENTIAL/PLATELET
Abs Immature Granulocytes: 0.02 10*3/uL (ref 0.00–0.07)
Basophils Absolute: 0 10*3/uL (ref 0.0–0.1)
Basophils Relative: 0 %
Eosinophils Absolute: 0.1 10*3/uL (ref 0.0–0.5)
Eosinophils Relative: 1 %
HCT: 44 % (ref 36.0–46.0)
Hemoglobin: 14.3 g/dL (ref 12.0–15.0)
Immature Granulocytes: 0 %
Lymphocytes Relative: 21 %
Lymphs Abs: 1.3 10*3/uL (ref 0.7–4.0)
MCH: 30.9 pg (ref 26.0–34.0)
MCHC: 32.5 g/dL (ref 30.0–36.0)
MCV: 95 fL (ref 80.0–100.0)
Monocytes Absolute: 0.4 10*3/uL (ref 0.1–1.0)
Monocytes Relative: 6 %
Neutro Abs: 4.2 10*3/uL (ref 1.7–7.7)
Neutrophils Relative %: 72 %
Platelets: 254 10*3/uL (ref 150–400)
RBC: 4.63 MIL/uL (ref 3.87–5.11)
RDW: 12.9 % (ref 11.5–15.5)
WBC: 6 10*3/uL (ref 4.0–10.5)
nRBC: 0 % (ref 0.0–0.2)

## 2019-02-06 LAB — BASIC METABOLIC PANEL
Anion gap: 7 (ref 5–15)
BUN: 14 mg/dL (ref 8–23)
CO2: 29 mmol/L (ref 22–32)
Calcium: 9.3 mg/dL (ref 8.9–10.3)
Chloride: 106 mmol/L (ref 98–111)
Creatinine, Ser: 0.59 mg/dL (ref 0.44–1.00)
GFR calc Af Amer: >60 mL/min (ref >60)
GFR calc non Af Amer: >60 mL/min (ref >60)
Glucose, Bld: 129 mg/dL — ABNORMAL HIGH (ref 70–99)
Potassium: 4.7 mmol/L (ref 3.5–5.1)
Sodium: 142 mmol/L (ref 135–145)

## 2019-02-06 LAB — CBG MONITORING, ED: Glucose-Capillary: 111 mg/dL — ABNORMAL HIGH (ref 70–99)

## 2019-02-06 NOTE — Discharge Instructions (Addendum)
Your blood work showed no acute abnormalities today.  Your blood sugar was just slightly elevated and this was likely what we call an acute phase reaction due to stress.  Blood sugar was only 111 which is something that it could be elevated to just after eating as well.  It is not something that you need to follow-up on her worry about. Your blood pressure improved significantly after taking your medication.  I encourage you to take your medication as directed by your doctor daily.  If you are having feelings that you are going to pass out with standing or getting very lightheaded or dizzy then you need to have your medication adjusted.  Please do not adjust your medications yourself.  Report to your physician and let them do the work so that they know what is going on with you regularly.  Try to take your blood pressure once a day on the same arm after taking your medicine.  Follow closely with your doctor for management of your blood pressure in the future.  Get help right away if: You develop a severe headache or confusion. You have unusual weakness or numbness, or you feel faint. You have severe pain in your chest or abdomen. You vomit repeatedly. You have trouble breathing.

## 2019-02-06 NOTE — ED Provider Notes (Signed)
[facility] EMERGENCY DEPARTMENT Provider Note   CSN: [id number] Arrival date & time: 02/06/19  1002     History Chief Complaint  Patient presents with  . Hypertension  . Headache  . Dizziness    Cathy Page is a 69 y.o. female  Who presents with multiple complaints.  1) patient was hypertensive to 160/110 on home monitor. It had been 5 days since she took her lisinopril. She states that her daughter told her "you better get to the ER NOW!" She took her full dose of lisinopril and a 1 mg xanax, which she does not take regularly. She states that she also does not take her lisinopril as directed.  She normally takes one half or 1/8 of the tablet because sometimes her blood pressure gets down to 110/60.  She denies having any symptomatic hypotension symptoms when this occurs.  The patient has been under significant stress over the past 5 days which she attributes to being very busy and because her daughter and grandchildren all have coronavirus.  She was tested with negative result in the last 48 hours.  2) patient also states that she normally has environmental allergies this time of year.  She states that she typically gets symptoms of mild sore throat, frontal sinus headache, some facial congestion at times.  She has had this for the past 4 to 5 weeks.  She has been taking allergy medications with minimal relief.  She also complains that she sometimes feels dizzy or off balance when she stands which resolves after stabilizing herself for a few seconds.  She denies having headache, dizziness or ataxia at this current time.  She denies a history of vertigo.  She denies room spinning dizziness or loss of consciousness.  She denies racing heart or melena.  HPI     Past Medical History:  Diagnosis Date  . Anxiety   . Bilateral renal cysts    simple  . Diverticulosis   . Endometrial polyp   . Fibromyalgia   . GERD (gastroesophageal reflux disease)   . History of  palpitations    cardiologist consult 2013 w/ dr Tressia Miners turner-- felt to be due to increasing thyroid medication on her own  . Hx of leukocytosis 11/30/2012  . Hyperlipidemia   . Hypertension   . Interstitial cystitis    Chronic, intermittent  . Pre-diabetes   . Type 2 diabetes mellitus ([place])   . Wears partial dentures    lower    Patient Active Problem List   Diagnosis Date Noted  . Chest pain, rule out acute myocardial infarction 01/20/2016  . Hx of leukocytosis 11/30/2012  . Elevated liver enzymes 11/30/2012  . Chest pain 11/23/2012  . Palpitation 11/23/2012  . Hyperlipidemia   . Hypertension   . Diabetes mellitus without complication [facility])     Past Surgical History:  Procedure Laterality Date  . CARDIOVASCULAR STRESS TEST  01/21/2016   normal nuclear study w/ no ischemia/  normal LV function and wall motion , stress ef 74%  . CESAREAN SECTION  x3  last one 1980   BILATERAL TUBAL LIGATION  W/ LAST C/S  . COLONOSCOPY  last one 2014  . D & C  FAILED HYSTEROSCOPY  04-23-2003  dr Bing Matter   severe anteverted uterus  . DILATATION & CURETTAGE/HYSTEROSCOPY WITH MYOSURE N/A 01/27/2016   Procedure: DILATATION & CURETTAGE/HYSTEROSCOPY WITH MYOSURE AND ENDOMETRIAL CURETTAGE;  Surgeon: Tyson Dense, MD;  Location: [facility];  Service: Gynecology;  Laterality: N/A;  . EXPLORATORY LAPAROTOMY/  OVARIAN CYSTECTOMY  1976   w/  APPENDECTOMY     OB History   No obstetric history on file.     Family History  Problem Relation Age of Onset  . Diabetes Father   . CAD Mother     Social History   Tobacco Use  . Smoking status: Former Smoker    Packs/day: 1.00    Years: 12.00    Pack years: 12.00    Types: Cigarettes    Quit date: 01/12/1983    Years since quitting: 36.0  . Smokeless tobacco: Never Used  Substance Use Topics  . Alcohol use: Yes    Comment: OCCASIONAL  . Drug use: No    Home Medications Prior to Admission medications   Medication Sig  Start Date End Date Taking? Authorizing Provider  ALPRAZolam Duanne Moron) 1 MG tablet Take 0.5 mg by mouth 2 (two) times daily as needed for anxiety.   Yes [provider]  Ascorbic Acid (VITAMIN C) 1000 MG tablet Take 1,000 mg by mouth daily.    Yes [provider]  Cholecalciferol (VITAMIN D-3) 1000 UNITS CAPS Take 2,000 Units by mouth daily.    Yes [provider]  Coenzyme Q10 (CO Q 10 PO) Take 2 tablets by mouth daily.    Yes [provider]  diphenhydrAMINE (BENADRYL) 25 mg capsule Take 50 mg by mouth daily as needed for allergies.    Yes [provider]  fish oil-omega-3 fatty acids 1000 MG capsule Take 3 g by mouth daily.    Yes [provider]  levothyroxine (SYNTHROID, LEVOTHROID) 50 MCG tablet Take 1 tablet daily by mouth. 11/17/16  Yes [provider]  liothyronine (CYTOMEL) 5 MCG tablet Take 10 mcg by mouth 2 (two) times daily.    Yes [provider]  lisinopril (ZESTRIL) 5 MG tablet Take 2.5 mg by mouth daily.    Yes [provider]  Multiple Vitamins-Minerals (MULTIVITAMIN PO) Take 1 tablet by mouth daily.   Yes [provider]  zinc gluconate 50 MG tablet Take 50 mg by mouth daily.   Yes [provider]    Allergies    Sulfa antibiotics, Codeine, and Hydrocodone  Review of Systems   Review of Systems Ten systems reviewed and are negative for acute change, except as noted in the HPI.   Physical Exam Updated Vital Signs BP 124/76   Pulse 78   Temp 97.7 F (36.5 C) (Oral)   Resp 18   Ht 5\' 4"  (1.626 m)   Wt 64.9 kg   SpO2 98%   BMI 24.55 kg/m   Physical Exam Vitals and nursing note reviewed.  Constitutional:      General: She is not in acute distress.    Appearance: She is well-developed. She is not diaphoretic.  HENT:     Head: Normocephalic and atraumatic.  Eyes:     General: No scleral icterus.    Conjunctiva/sclera: Conjunctivae normal.     Pupils: Pupils are  equal, round, and reactive to light.     Comments: No horizontal, vertical or rotational nystagmus  Neck:     Comments: Full active and passive ROM without pain No midline or paraspinal tenderness No nuchal rigidity or meningeal signs Cardiovascular:     Rate and Rhythm: Normal rate and regular rhythm.  Pulmonary:     Effort: Pulmonary effort is normal. No respiratory distress.     Breath sounds: Normal breath sounds. No  wheezing or rales.  Abdominal:     General: Bowel sounds are normal.     Palpations: Abdomen is soft.     Tenderness: There is no abdominal tenderness. There is no guarding or rebound.  Musculoskeletal:        General: Normal range of motion.     Cervical back: Normal range of motion and neck supple.  Lymphadenopathy:     Cervical: No cervical adenopathy.  Skin:    General: Skin is warm and dry.     Findings: No rash.  Neurological:     Mental Status: She is alert and oriented to person, place, and time.     Cranial Nerves: No cranial nerve deficit.     Sensory: No sensory deficit.     Motor: No abnormal muscle tone.     Coordination: Coordination normal.     Deep Tendon Reflexes: Reflexes are normal and symmetric.     Comments: Mental Status:  Alert, oriented, thought content appropriate. Speech fluent without evidence of aphasia. Able to follow 2 step commands without difficulty.  Cranial Nerves:  II:  Peripheral visual fields grossly normal, pupils equal, round, reactive to light III,IV, VI: ptosis not present, extra-ocular motions intact bilaterally  V,VII: smile symmetric, facial light touch sensation equal VIII: hearing grossly normal bilaterally  IX,X: midline uvula rise  XI: bilateral shoulder shrug equal and strong XII: midline tongue extension  Motor:  5/5 in upper and lower extremities bilaterally including strong and equal grip strength and dorsiflexion/plantar flexion Sensory: Pinprick and light touch normal in all extremities.  Deep Tendon  Reflexes: 2+ and symmetric  Cerebellar: normal finger-to-nose with bilateral upper extremities Gait: normal gait and balance CV: distal pulses palpable throughout   Psychiatric:        Behavior: Behavior normal.        Thought Content: Thought content normal.        Judgment: Judgment normal.     ED Results / Procedures / Treatments   Labs (all labs ordered are listed, but only abnormal results are displayed) Labs Reviewed  BASIC METABOLIC PANEL - Abnormal; Notable for the following components:      Result Value   Glucose, Bld 129 (*)    All other components within normal limits  CBG MONITORING, ED - Abnormal; Notable for the following components:   Glucose-Capillary 111 (*)    All other components within normal limits  CBC WITH DIFFERENTIAL/PLATELET    EKG EKG Interpretation  Date/Time:  Tuesday February 06 2019 10:54:46 EST Ventricular Rate:  87 PR Interval:    QRS Duration: 90 QT Interval:  359 QTC Calculation: 432 R Axis:   86 Text Interpretation: Sinus rhythm Borderline right axis deviation Baseline wander in lead(s) V5 Confirmed by Quintella Reichert [phone]) on 02/06/2019 10:58:13 AM   Radiology No results found.  Procedures Procedures (including critical care time)  Medications Ordered in ED Medications - No data to display  ED Course  I have reviewed the triage vital signs and the nursing notes.  Pertinent labs & imaging results that were available during my care of the patient were reviewed by me and considered in my medical decision making (see chart for details).    MDM Rules/Calculators/A&P                     Cathy Page is a 69 y.o. female who presents to ED for elevated blood pressure. Patient is currently asymptomatic with no sxs to suggest end organ  damage. No chest pain, diaphoresis, nausea or other ACS symptoms. No headache or neurologic complaints. No change in urine output. Normal cardiopulmonary exam. Intact and equal distal pulses. Labs  reviewed and reassuring: Mildly elevated blood glucose glucose of insignificant value.  BMP without other abnormality, CMP without other abnormality.  EKG shows normal sinus rhythm at a rate of 87.  Orthostatic vital signs are negative.  Evaluation does not show pathology that would require ongoing emergent intervention or inpatient treatment.Stressed the importance of taking blood pressure medication as directed.  Patient understands importance of close PCP follow up for further BP management. Reasons to return to ER were discussed and all questions answered.    Final Clinical Impression(s) / ED Diagnoses Final diagnoses:  Elevated blood pressure reading  Positional lightheadedness    Rx / DC Orders ED Discharge Orders    None       Margarita Mail, PA-C 02/07/19 0804    Quintella Reichert, MD 02/08/19 [phone]

## 2019-02-06 NOTE — ED Triage Notes (Signed)
Pt reports stopping taking her blood pressure medicine about a week ago due to normal blood pressures. Checked last night and was higher. Pt c/o headache and dizziness since stopping medication. Alert, oriented x4, appropriate. Ambulatory.

## 2019-03-18 ENCOUNTER — Ambulatory Visit: Payer: Medicare Other

## 2019-05-08 ENCOUNTER — Other Ambulatory Visit: Payer: Self-pay | Admitting: Gastroenterology

## 2019-05-08 DIAGNOSIS — R1010 Upper abdominal pain, unspecified: Secondary | ICD-10-CM

## 2019-05-09 ENCOUNTER — Ambulatory Visit: Payer: Medicare Other | Admitting: Cardiology

## 2019-05-21 ENCOUNTER — Ambulatory Visit: Payer: Medicare Other | Admitting: Cardiology

## 2019-05-21 ENCOUNTER — Encounter: Payer: Self-pay | Admitting: Cardiology

## 2019-05-21 ENCOUNTER — Other Ambulatory Visit: Payer: Self-pay

## 2019-05-21 VITALS — BP 124/78 | HR 92 | Temp 96.7°F | Ht 64.0 in | Wt 148.0 lb

## 2019-05-21 DIAGNOSIS — E7800 Pure hypercholesterolemia, unspecified: Secondary | ICD-10-CM

## 2019-05-21 DIAGNOSIS — I2510 Atherosclerotic heart disease of native coronary artery without angina pectoris: Secondary | ICD-10-CM | POA: Diagnosis not present

## 2019-05-21 DIAGNOSIS — I10 Essential (primary) hypertension: Secondary | ICD-10-CM

## 2019-05-21 DIAGNOSIS — Z7189 Other specified counseling: Secondary | ICD-10-CM | POA: Diagnosis not present

## 2019-05-21 MED ORDER — PRAVASTATIN SODIUM 20 MG PO TABS: 20.0000 mg | ORAL_TABLET | Freq: Every evening | ORAL | 11 refills | Status: DC

## 2019-05-21 NOTE — Progress Notes (Signed)
Cardiology Office Note:    Date:  05/21/2019   ID:  CHERESE Page, DOB [birth year], MRN [id number]  PCP:  Caren Macadam, MD  Cardiologist:  Buford Dresser, MD  Referring MD: Caren Macadam, MD   CC: new patient evaluation for palpitations  History of Present Illness:    Cathy Page is a 69 y.o. female with a hx of hypertension, interstitial cystitis, fibromyalgia who is seen as a new patient to me at the request of Caren Macadam, MD for the evaluation and management of racing heart beats. She was last seen by Lyda Jester on 11/08/2016.  Note from 03/29/19 with Dr. Mannie Stabile reviewed. Patient and her daughter requested cardiology referral for high heart rate with anxiety and chronic chest pain. She was seen by Dr. Radford Pax in the past.  Patient concerns today: Has been to the hospital on several occasions, told it was anxiety. Was monitoring her BP and HR closely, managing her lisinopril dosing at home based on her home BP readings. Wants to make sure that her heart is ok. No recent palpitations but have happened intermittently in the past.  Cardiovascular risk factors: Prior clinical ASCVD: nonobstructive CAD on prior remote cath per report and on prior cardiac CT. We reviewed her CT from 11/2016.  Comorbid conditions: high chronic stress, well controlled hypertension. Denies hyperlipidemia, diabetes, chronic kidney disease  Metabolic syndrome/Obesity: BMI 25 Chronic inflammatory conditions: none Tobacco use history: not a smoker Family history: mother died of MI, had 4V CABG 96 years prior in her early 44s. Grandfather has MI in his early 46s. Father had MI, but father's side not as bad as mother's side. Exercise level: tries to stay active Current diet: has cut out processed foods, trying to eat more Mediterranean diet  Lipids reviewed from 09/11/2018. Tchol 277, HDL 61, LDL 195, TG 105. We discussed the data for statins at length. She follows podcasts aimed at the  natural treatment of disease. She has also heard that you need more cholesterol to be healthy. We discussed the difference between good and bad cholesterol.   Denies recent chest pain, shortness of breath at rest or with normal exertion. No PND, orthopnea, LE edema or unexpected weight gain. No syncope.  Past Medical History:  Diagnosis Date  . Anxiety   . Bilateral renal cysts    simple  . Diverticulosis   . Endometrial polyp   . Fibromyalgia   . GERD (gastroesophageal reflux disease)   . History of palpitations    cardiologist consult 2013 w/ dr Tressia Miners turner-- felt to be due to increasing thyroid medication on her own  . Hx of leukocytosis 11/30/2012  . Hyperlipidemia   . Hypertension   . Interstitial cystitis    Chronic, intermittent  . Pre-diabetes   . Type 2 diabetes mellitus ([place])   . Wears partial dentures    lower    Past Surgical History:  Procedure Laterality Date  . CARDIOVASCULAR STRESS TEST  01/21/2016   normal nuclear study w/ no ischemia/  normal LV function and wall motion , stress ef 74%  . CESAREAN SECTION  x3  last one 1980   BILATERAL TUBAL LIGATION  W/ LAST C/S  . COLONOSCOPY  last one 2014  . D & C  FAILED HYSTEROSCOPY  04-23-2003  dr Bing Matter   severe anteverted uterus  . DILATATION & CURETTAGE/HYSTEROSCOPY WITH MYOSURE N/A 01/27/2016   Procedure: DILATATION & CURETTAGE/HYSTEROSCOPY WITH MYOSURE AND ENDOMETRIAL CURETTAGE;  Surgeon: Tyson Dense, MD;  Location: [facility];  Service: Gynecology;  Laterality: N/A;  . EXPLORATORY LAPAROTOMY/  OVARIAN CYSTECTOMY  1976   w/  APPENDECTOMY    Current Medications: Current Outpatient Medications on File Prior to Visit  Medication Sig  . ALPRAZolam (XANAX) 1 MG tablet Take 0.5 mg by mouth 2 (two) times daily as needed for anxiety.  . Ascorbic Acid (VITAMIN C) 1000 MG tablet Take 1,000 mg by mouth daily.   . Cholecalciferol (VITAMIN D-3) 1000 UNITS CAPS Take 2,000 Units by mouth daily.    . Coenzyme Q10 (CO Q 10 PO) Take 2 tablets by mouth daily.   . diphenhydrAMINE (BENADRYL) 25 mg capsule Take 50 mg by mouth daily as needed for allergies.   . fish oil-omega-3 fatty acids 1000 MG capsule Take 3 g by mouth daily.   Marland Kitchen levothyroxine (SYNTHROID, LEVOTHROID) 50 MCG tablet Take 1 tablet daily by mouth.  . liothyronine (CYTOMEL) 5 MCG tablet Take 10 mcg by mouth 2 (two) times daily.   Marland Kitchen lisinopril (ZESTRIL) 5 MG tablet Take 2.5 mg by mouth daily.   . Multiple Vitamins-Minerals (MULTIVITAMIN PO) Take 1 tablet by mouth daily.  . sertraline (ZOLOFT) 50 MG tablet Take 50 mg by mouth daily.  Marland Kitchen zinc gluconate 50 MG tablet Take 50 mg by mouth daily.   No current facility-administered medications on file prior to visit.     Allergies:   Sulfa antibiotics, Codeine, and Hydrocodone   Social History   Tobacco Use  . Smoking status: Former Smoker    Packs/day: 1.00    Years: 12.00    Pack years: 12.00    Types: Cigarettes    Quit date: 01/12/1983    Years since quitting: 36.3  . Smokeless tobacco: Never Used  Substance Use Topics  . Alcohol use: Yes    Comment: OCCASIONAL  . Drug use: No    Family History: family history includes CAD in her mother; Diabetes in her father.  ROS:   Please see the history of present illness.  Additional pertinent ROS: Constitutional: Negative for chills, fever, night sweats, unintentional weight loss  HENT: Negative for ear pain and hearing loss.   Eyes: Negative for loss of vision and eye pain.  Respiratory: Negative for cough, sputum, wheezing.   Cardiovascular: See HPI. Gastrointestinal: Negative for abdominal pain, melena, and hematochezia.  Genitourinary: Negative for dysuria and hematuria.  Musculoskeletal: Negative for falls and myalgias.  Skin: Negative for itching and rash.  Neurological: Negative for focal weakness, focal sensory changes and loss of consciousness.  Endo/Heme/Allergies: Does not bruise/bleed easily.     EKGs/Labs/Other Studies Reviewed:    The following studies were reviewed today: MPI 06-Apr-2012 Normal stress test  EKG:  EKG is personally reviewed.  The ekg ordered today demonstrates NSR at 92 bpm  Recent Labs: 02/06/2019: BUN 14; Creatinine, Ser 0.59; Hemoglobin 14.3; Platelets 254; Potassium 4.7; Sodium 142  Recent Lipid Panel No results found for: CHOL, TRIG, HDL, CHOLHDL, VLDL, LDLCALC, LDLDIRECT  Physical Exam:    VS:  BP 124/78   Pulse 92   Temp (!) 96.7 F (35.9 C)   Ht 5\' 4"  (1.626 m)   Wt 148 lb (67.1 kg)   SpO2 97%   BMI 25.40 kg/m     Wt Readings from Last 3 Encounters:  05/21/19 148 lb (67.1 kg)  02/06/19 143 lb (64.9 kg)  07/14/17 160 lb (72.6 kg)    GEN: Well nourished, well developed in no acute distress HEENT: Normal, moist mucous membranes  NECK: No JVD CARDIAC: regular rhythm, normal S1 and S2, no rubs or gallops. No murmurs. VASCULAR: Radial and DP pulses 2+ bilaterally. No carotid bruits RESPIRATORY:  Clear to auscultation without rales, wheezing or rhonchi  ABDOMEN: Soft, non-tender, non-distended MUSCULOSKELETAL:  Ambulates independently SKIN: Warm and dry, no edema NEUROLOGIC:  Alert and oriented x 3. No focal neuro deficits noted. PSYCHIATRIC:  Normal affect    ASSESSMENT:    1. Hypercholesterolemia   2. Nonocclusive coronary atherosclerosis of native coronary artery   3. Essential hypertension   4. Cardiac risk counseling   5. Counseling on health promotion and disease prevention    PLAN:    Hyperlipidemia: -discussed pathology of cholesterol, how plaques form, that MI/CVA result commonly from acute plaque rupture and not gradual stenosis. Discussed mechanism of statin to both decrease plaque accumulation and stabilize plaque that is already present.  -reviewed her prior cholesterol numbers -reviewed her ASCVD risk (and with reported prior nonobstructive CAD, would be secondary prevention) -after shared decision making, will start  pravastatin low dose and monitor response -recheck lipids/LFTs at follow up  Hypertension: -at goal of <130/80 today -continue lisinopril 2.5 mg daily  Nonobstructive CAD: based on prior reports. Asymptomatic today -discussed statin, as above. Prefers lifestyle management but willing to trial low dose of mild statin  History of palpitations: -ECG unremarkable today -discussed options for further evaluation. She will contact us if this becomes more frequent and we can consider a monitor  Cardiac risk counseling and prevention recommendations: -recommend heart healthy/Mediterranean diet, with whole grains, fruits, vegetable, fish, lean meats, nuts, and olive oil. Limit salt. -recommend moderate walking, 3-5 times/week for 30-50 minutes each session. Aim for at least 150 minutes.week. Goal should be pace of 3 miles/hours, or walking 1.5 miles in 30 minutes -recommend avoidance of tobacco products. Avoid excess alcohol. -Additional risk factor control:  -Diabetes risk: A1c is 5.6 per [facility] 08/2018  -Weight: BMI 25 -ASCVD risk score: The 10-year ASCVD risk score Mikey Bussing DC Brooke Bonito., et al., 2013) is: 23.2%   Values used to calculate the score:     Age: 37 years     Sex: Female     Is Non-Hispanic African American: No     Diabetic: Yes     Tobacco smoker: No     Systolic Blood Pressure: [id number] mmHg     Is BP treated: Yes     HDL Cholesterol: 64 mg/dL     Total Cholesterol: 283 mg/dL    Plan for follow up: 2 mos  Buford Dresser, MD, PhD [facility]  [facility]    Medication Adjustments/Labs and Tests Ordered: Current medicines are reviewed at length with the patient today.  Concerns regarding medicines are outlined above.  Orders Placed This Encounter  Procedures  . Lipid panel  . EKG 12-Lead   Meds ordered this encounter  Medications  . pravastatin (PRAVACHOL) 20 MG tablet    Sig: Take 1 tablet (20 mg total) by mouth every evening.    Dispense:  30 tablet    Refill:  11     Patient Instructions  Medication Instructions:  Start taking Pravastatin 20 mg daily  *If you need a refill on your cardiac medications before your next appointment, please call your pharmacy*   Lab Work: We will recheck lipids today. Your prior were: Total cholesterol 277 (we don't use total much anymore) HDL 61 (good cholesterol, this is a good number) LDL 195 (bad cholesterol, for you would like <100 or ideally <  70, but this may be difficult) Triglycerides 105 (less than 150 ideal)  If you have labs (blood work) drawn today and your tests are completely normal, you will receive your results only by: Marland Kitchen MyChart Message (if you have MyChart) OR . A paper copy in the mail If you have any lab test that is abnormal or we need to change your treatment, we will call you to review the results.   Testing/Procedures: None   Follow-Up: At [facility], you and your health needs are our priority.  As part of our continuing mission to provide you with exceptional heart care, we have created designated Provider Care Teams.  These Care Teams include your primary Cardiologist (physician) and Advanced Practice Providers (APPs -  Physician Assistants and Nurse Practitioners) who all work together to provide you with the care you need, when you need it.  We recommend signing up for the patient portal called "MyChart".  Sign up information is provided on this After Visit Summary.  MyChart is used to connect with patients for Virtual Visits (Telemedicine).  Patients are able to view lab/test results, encounter notes, upcoming appointments, etc.  Non-urgent messages can be sent to your provider as well.   To learn more about what you can do with MyChart, go to [email or web address].    Your next appointment:   2 month(s)  The format for your next appointment:   In Person  Provider:   Buford Dresser, MD     Signed, Buford Dresser, MD PhD 05/21/2019  [place]

## 2019-05-21 NOTE — Patient Instructions (Addendum)
Medication Instructions:  Start taking Pravastatin 20 mg daily  *If you need a refill on your cardiac medications before your next appointment, please call your pharmacy*   Lab Work: We will recheck lipids today. Your prior were: Total cholesterol 277 (we don't use total much anymore) HDL 61 (good cholesterol, this is a good number) LDL 195 (bad cholesterol, for you would like <100 or ideally <70, but this may be difficult) Triglycerides 105 (less than 150 ideal)  If you have labs (blood work) drawn today and your tests are completely normal, you will receive your results only by: Marland Kitchen MyChart Message (if you have MyChart) OR . A paper copy in the mail If you have any lab test that is abnormal or we need to change your treatment, we will call you to review the results.   Testing/Procedures: None   Follow-Up: At [facility], you and your health needs are our priority.  As part of our continuing mission to provide you with exceptional heart care, we have created designated Provider Care Teams.  These Care Teams include your primary Cardiologist (physician) and Advanced Practice Providers (APPs -  Physician Assistants and Nurse Practitioners) who all work together to provide you with the care you need, when you need it.  We recommend signing up for the patient portal called "MyChart".  Sign up information is provided on this After Visit Summary.  MyChart is used to connect with patients for Virtual Visits (Telemedicine).  Patients are able to view lab/test results, encounter notes, upcoming appointments, etc.  Non-urgent messages can be sent to your provider as well.   To learn more about what you can do with MyChart, go to [email or web address].    Your next appointment:   2 month(s)  The format for your next appointment:   In Person  Provider:   Buford Dresser, MD

## 2019-05-22 LAB — LIPID PANEL
Chol/HDL Ratio: 4.4 ratio (ref 0.0–4.4)
Cholesterol, Total: 283 mg/dL — ABNORMAL HIGH (ref 100–199)
HDL: 64 mg/dL (ref >39)
LDL Chol Calc (NIH): 176 mg/dL — ABNORMAL HIGH (ref 0–99)
Triglycerides: 228 mg/dL — ABNORMAL HIGH (ref 0–149)
VLDL Cholesterol Cal: 43 mg/dL — ABNORMAL HIGH (ref 5–40)

## 2019-05-29 ENCOUNTER — Telehealth: Payer: Self-pay | Admitting: Cardiology

## 2019-05-29 NOTE — Telephone Encounter (Signed)
Pt requesting results for lab work. Will route to MD.

## 2019-05-29 NOTE — Telephone Encounter (Signed)
Follow Up:     Pt would like her lab results from last week please. 

## 2019-07-10 ENCOUNTER — Encounter: Payer: Self-pay | Admitting: Cardiology

## 2019-07-14 ENCOUNTER — Other Ambulatory Visit: Payer: Self-pay

## 2019-07-14 ENCOUNTER — Emergency Department (HOSPITAL_COMMUNITY): Payer: Medicare Other

## 2019-07-14 ENCOUNTER — Encounter (HOSPITAL_COMMUNITY): Payer: Self-pay | Admitting: Emergency Medicine

## 2019-07-14 ENCOUNTER — Emergency Department (HOSPITAL_COMMUNITY)
Admission: EM | Admit: 2019-07-14 | Discharge: 2019-07-14 | Disposition: A | Discharge status: Home / Self Care | Payer: Medicare Other | Attending: Emergency Medicine | Admitting: Emergency Medicine

## 2019-07-14 DIAGNOSIS — Z79899 Other long term (current) drug therapy: Secondary | ICD-10-CM | POA: Diagnosis not present

## 2019-07-14 DIAGNOSIS — E119 Type 2 diabetes mellitus without complications: Secondary | ICD-10-CM | POA: Insufficient documentation

## 2019-07-14 DIAGNOSIS — Z7984 Long term (current) use of oral hypoglycemic drugs: Secondary | ICD-10-CM | POA: Insufficient documentation

## 2019-07-14 DIAGNOSIS — I10 Essential (primary) hypertension: Secondary | ICD-10-CM | POA: Diagnosis not present

## 2019-07-14 DIAGNOSIS — R0789 Other chest pain: Secondary | ICD-10-CM | POA: Insufficient documentation

## 2019-07-14 DIAGNOSIS — Z87891 Personal history of nicotine dependence: Secondary | ICD-10-CM | POA: Insufficient documentation

## 2019-07-14 DIAGNOSIS — R079 Chest pain, unspecified: Secondary | ICD-10-CM

## 2019-07-14 LAB — CBC
HCT: 39.4 % (ref 36.0–46.0)
Hemoglobin: 12.6 g/dL (ref 12.0–15.0)
MCH: 30.1 pg (ref 26.0–34.0)
MCHC: 32 g/dL (ref 30.0–36.0)
MCV: 94.3 fL (ref 80.0–100.0)
Platelets: 260 10*3/uL (ref 150–400)
RBC: 4.18 MIL/uL (ref 3.87–5.11)
RDW: 12.3 % (ref 11.5–15.5)
WBC: 6.5 10*3/uL (ref 4.0–10.5)
nRBC: 0 % (ref 0.0–0.2)

## 2019-07-14 LAB — BASIC METABOLIC PANEL
Anion gap: 10 (ref 5–15)
BUN: 25 mg/dL — ABNORMAL HIGH (ref 8–23)
CO2: 24 mmol/L (ref 22–32)
Calcium: 9.3 mg/dL (ref 8.9–10.3)
Chloride: 102 mmol/L (ref 98–111)
Creatinine, Ser: 0.61 mg/dL (ref 0.44–1.00)
GFR calc Af Amer: >60 mL/min (ref >60)
GFR calc non Af Amer: >60 mL/min (ref >60)
Glucose, Bld: 108 mg/dL — ABNORMAL HIGH (ref 70–99)
Potassium: 3.9 mmol/L (ref 3.5–5.1)
Sodium: 136 mmol/L (ref 135–145)

## 2019-07-14 LAB — TROPONIN I (HIGH SENSITIVITY)
Troponin I (High Sensitivity): 4 ng/L (ref <18)
Troponin I (High Sensitivity): 4 ng/L (ref <18)

## 2019-07-14 MED ORDER — SODIUM CHLORIDE 0.9% FLUSH: 3.0000 mL | Freq: Once | INTRAVENOUS | Status: DC

## 2019-07-14 NOTE — ED Triage Notes (Signed)
Patient with chest pain and nausea that started 3-4 hours ago.  Patient denies any shortness of breath at this time.  Patient states that it has happened before, she was diagnosed with anxiety but she was put on medicine awhile ago.  She didn't have any more anxiety issues.  Patient not having pain that would not go away at this time.  She feels "weird".

## 2019-07-14 NOTE — Discharge Instructions (Signed)
Your work-up here in the emergency department makes it unlikely that you are having a heart attack.  Please call your cardiologist on Monday and see when they want to see you in the office.  Please return to the ED in the meantime if you develop recurrence of your symptoms especially if you realize they occur when you are going up stairs or walking down the street or if you have trouble breathing or get sweaty with it.

## 2019-07-14 NOTE — ED Provider Notes (Signed)
[facility] Provider Note   CSN: [id number] Arrival date & time: 07/14/19  0138     History Chief Complaint  Patient presents with  . Chest Pain    Cathy Page is a 69 y.o. female.  69 yo F with a chief complaint of chest pain.  This is described as a pressure and it is hard to pinpoint.  Occurred while she was at rest you last night.  Nothing seemed to make it better or worse and that resolved after about 3 hours.  She felt like she could have had indigestion.  Has had symptoms very similar to this that she has been told was a panic attack.  She denies any shortness of breath with this.  Not exertional.  No diaphoresis.  Had some nausea with it.  Felt generally fatigued.  She has not had an issue with these for the past 5 or 6 months after starting a SSRI.  She denies cough congestion or fever.  Denies trauma to the chest.  She tells me she has had a stress test she thinks fairly recently that was negative.  She was recently started on a statin for high cholesterol.  She has a history of hypertension hyperlipidemia and a family history of MI.  She denies diabetes or smoking.  She denies history of PE or DVT denies hemoptysis denies unilateral lower extremity edema denies recent surgery immobilization or hospitalization.  She denies history of cancer.  She denies estrogen use.  The history is provided by the patient.  Chest Pain Pain location:  Substernal area Pain quality: pressure   Pain radiates to:  Does not radiate Pain severity:  Moderate Onset quality:  Gradual Duration:  4 hours Timing:  Constant Progression:  Resolved Chronicity:  Recurrent Relieved by:  Nothing Worsened by:  Nothing Ineffective treatments:  None tried Associated symptoms: fatigue and nausea   Associated symptoms: no dizziness, no fever, no headache, no palpitations, no shortness of breath and no vomiting        Past Medical History:  Diagnosis Date  .  Anxiety   . Bilateral renal cysts    simple  . Diverticulosis   . Endometrial polyp   . Fibromyalgia   . GERD (gastroesophageal reflux disease)   . History of palpitations    cardiologist consult 2013 w/ dr Tressia Miners turner-- felt to be due to increasing thyroid medication on her own  . Hx of leukocytosis 11/30/2012  . Hyperlipidemia   . Hypertension   . Interstitial cystitis    Chronic, intermittent  . Pre-diabetes   . Type 2 diabetes mellitus ([place])   . Wears partial dentures    lower    Patient Active Problem List   Diagnosis Date Noted  . Chest pain, rule out acute myocardial infarction 01/20/2016  . Hx of leukocytosis 11/30/2012  . Elevated liver enzymes 11/30/2012  . Chest pain 11/23/2012  . Palpitation 11/23/2012  . Hyperlipidemia   . Hypertension   . Diabetes mellitus without complication [facility])     Past Surgical History:  Procedure Laterality Date  . CARDIOVASCULAR STRESS TEST  01/21/2016   normal nuclear study w/ no ischemia/  normal LV function and wall motion , stress ef 74%  . CESAREAN SECTION  x3  last one 1980   BILATERAL TUBAL LIGATION  W/ LAST C/S  . COLONOSCOPY  last one 2014  . D & C  FAILED HYSTEROSCOPY  04-23-2003  dr Bing Matter   severe anteverted  uterus  . DILATATION & CURETTAGE/HYSTEROSCOPY WITH MYOSURE N/A 01/27/2016   Procedure: DILATATION & CURETTAGE/HYSTEROSCOPY WITH MYOSURE AND ENDOMETRIAL CURETTAGE;  Surgeon: Tyson Dense, MD;  Location: [place];  Service: Gynecology;  Laterality: N/A;  . EXPLORATORY LAPAROTOMY/  OVARIAN CYSTECTOMY  1976   w/  APPENDECTOMY     OB History   No obstetric history on file.     Family History  Problem Relation Age of Onset  . Diabetes Father   . CAD Mother     Social History   Tobacco Use  . Smoking status: Former Smoker    Packs/day: 1.00    Years: 12.00    Pack years: 12.00    Types: Cigarettes    Quit date: 01/12/1983    Years since quitting: 36.5  . Smokeless tobacco:  Never Used  Vaping Use  . Vaping Use: Never used  Substance Use Topics  . Alcohol use: Yes    Comment: OCCASIONAL  . Drug use: No    Home Medications Prior to Admission medications   Medication Sig Start Date End Date Taking? Authorizing Provider  ALPRAZolam Duanne Moron) 1 MG tablet Take 0.5 mg by mouth 2 (two) times daily as needed for anxiety.    [provider]  Ascorbic Acid (VITAMIN C) 1000 MG tablet Take 1,000 mg by mouth daily.     [provider]  Cholecalciferol (VITAMIN D-3) 1000 UNITS CAPS Take 2,000 Units by mouth daily.     [provider]  Coenzyme Q10 (CO Q 10 PO) Take 2 tablets by mouth daily.     [provider]  diphenhydrAMINE (BENADRYL) 25 mg capsule Take 50 mg by mouth daily as needed for allergies.     [provider]  fish oil-omega-3 fatty acids 1000 MG capsule Take 3 g by mouth daily.     [provider]  levothyroxine (SYNTHROID, LEVOTHROID) 50 MCG tablet Take 1 tablet daily by mouth. 11/17/16   [provider]  liothyronine (CYTOMEL) 5 MCG tablet Take 10 mcg by mouth 2 (two) times daily.     [provider]  lisinopril (ZESTRIL) 5 MG tablet Take 2.5 mg by mouth daily.     [provider]  Multiple Vitamins-Minerals (MULTIVITAMIN PO) Take 1 tablet by mouth daily.    [provider]  pravastatin (PRAVACHOL) 20 MG tablet Take 1 tablet (20 mg total) by mouth every evening. 05/21/19 08/19/19  Buford Dresser, MD  sertraline (ZOLOFT) 50 MG tablet Take 50 mg by mouth daily. 05/04/19   [provider]  zinc gluconate 50 MG tablet Take 50 mg by mouth daily.    [provider]    Allergies    Sulfa antibiotics, Codeine, and Hydrocodone  Review of Systems   Review of Systems  Constitutional: Positive for fatigue. Negative for chills and fever.  HENT: Negative for congestion and rhinorrhea.   Eyes: Negative for redness and visual disturbance.  Respiratory:  Negative for shortness of breath and wheezing.   Cardiovascular: Positive for chest pain. Negative for palpitations.  Gastrointestinal: Positive for nausea. Negative for vomiting.  Genitourinary: Negative for dysuria and urgency.  Musculoskeletal: Negative for arthralgias and myalgias.  Skin: Negative for pallor and wound.  Neurological: Negative for dizziness and headaches.    Physical Exam Updated Vital Signs BP 118/66   Pulse 67   Temp 98.1 F (36.7 C) (Oral)   Resp 16   SpO2 97%   Physical Exam Vitals and nursing note reviewed.  Constitutional:      General: She is not in acute distress.    Appearance: She is well-developed. She is not diaphoretic.  HENT:     Head: Normocephalic and atraumatic.  Eyes:     Pupils: Pupils are equal, round, and reactive to light.  Cardiovascular:     Rate and Rhythm: Normal rate and regular rhythm.     Heart sounds: No murmur heard.  No friction rub. No gallop.   Pulmonary:     Effort: Pulmonary effort is normal.     Breath sounds: No wheezing, rhonchi or rales.  Abdominal:     General: There is no distension.     Palpations: Abdomen is soft.     Tenderness: There is no abdominal tenderness.  Musculoskeletal:        General: No tenderness.     Cervical back: Normal range of motion and neck supple.  Skin:    General: Skin is warm and dry.  Neurological:     Mental Status: She is alert and oriented to person, place, and time.  Psychiatric:        Behavior: Behavior normal.     ED Results / Procedures / Treatments   Labs (all labs ordered are listed, but only abnormal results are displayed) Labs Reviewed  BASIC METABOLIC PANEL - Abnormal; Notable for the following components:      Result Value   Glucose, Bld 108 (*)    BUN 25 (*)    All other components within normal limits  CBC  TROPONIN I (HIGH SENSITIVITY)  TROPONIN I (HIGH SENSITIVITY)    EKG EKG Interpretation  Date/Time:  Saturday July 14 2019 01:47:01  EDT Ventricular Rate:  78 PR Interval:  152 QRS Duration: 88 QT Interval:  368 QTC Calculation: 419 R Axis:   82 Text Interpretation: Normal sinus rhythm Normal ECG No significant change since last tracing Confirmed by Deno Etienne [phone]) on 07/14/2019 7:04:13 AM   Radiology DG Chest 2 View  Result Date: 07/14/2019 CLINICAL DATA:  Chest pain and nausea EXAM: CHEST - 2 VIEW COMPARISON:  Radiograph 01/20/2016 FINDINGS: The cardiomediastinal contours are normal. The lungs are clear. Pulmonary vasculature is normal. No consolidation, pleural effusion, or pneumothorax. No acute osseous abnormalities are seen. EKG lead overlies the chest. Chronic changes in both shoulders. IMPRESSION: No acute chest findings. Electronically Signed   By: Keith Rake M.D.   On: 07/14/2019 02:29    Procedures Procedures (including critical care time)  Medications Ordered in ED Medications  sodium chloride flush (NS) 0.9 % injection 3 mL (has no administration in time range)    ED Course  I have reviewed the triage vital signs and the nursing notes.  Pertinent labs & imaging results that were available during my care of the patient were reviewed by me and considered in my medical decision making (see chart for details).    MDM Rules/Calculators/A&P                          69 yo F with a chief complaint of chest pain.  This feels like in the past when she had a panic attack.  Has resolved by the time she arrived to the ED.  She has 2 troponins here that are negative.  EKG without any changes compared to prior.  Chest x-ray viewed by me without focal infiltrate or pneumothorax.  I discussed the results with the patient.  She will call her cardiologist  on Monday to set up a follow-up appointment.  7:46 AM:  I have discussed the diagnosis/risks/treatment options with the patient and believe the pt to be eligible for discharge home to follow-up with Cards. We also discussed returning to the ED immediately if  new or worsening sx occur. We discussed the sx which are most concerning (e.g., sudden worsening pain, fever, inability to tolerate by mouth) that necessitate immediate return. Medications administered to the patient during their visit and any new prescriptions provided to the patient are listed below.  Medications given during this visit Medications  sodium chloride flush (NS) 0.9 % injection 3 mL (has no administration in time range)     The patient appears reasonably screen and/or stabilized for discharge and I doubt any other medical condition or other [facility] requiring further screening, evaluation, or treatment in the ED at this time prior to discharge.   Final Clinical Impression(s) / ED Diagnoses Final diagnoses:  Nonspecific chest pain    Rx / DC Orders ED Discharge Orders    None       Deno Etienne, DO 07/14/19 [phone]

## 2019-07-14 NOTE — ED Notes (Signed)
Pt stating chest pain has subsided, she is considering going home, this RN encouraged patient to stay and have repeat trop done and be seen by provider. Pt agreeable at this time.

## 2019-07-17 ENCOUNTER — Telehealth: Payer: Self-pay | Admitting: Cardiology

## 2019-07-17 NOTE — Telephone Encounter (Signed)
Patient went to the hospital last Thursday because she thought she was having a heart attack. When she was discharged, she was advised to reach out to Dr. Harrell Gave in regards to a test that needed to be done since she has not had one done in 7 years. She is unsure of the name of the test. Looking through her chart, it looks like a stress test has not been done since 2014. This may be the test he is referring too, please advise.

## 2019-07-17 NOTE — Telephone Encounter (Signed)
Spoke with  patient -  Will  Send message to Dr Harrell Gave to see if she wants to schedule  Any testing prior to Aug appointment. Or wait until she sees the patient.  Patient aware will defer and call her back once a response from Dr Harrell Gave

## 2019-07-25 NOTE — Telephone Encounter (Signed)
Pt updated and voiced she had another episode of CP last week. Nurse. Virtual appointment scheduled for 7/19 with Dr. Harrell Gave. Pt verbalized understanding to report to ER if symptoms reoccur between now and appointment.

## 2019-07-25 NOTE — Telephone Encounter (Signed)
Reviewed her ER visit, ECG, and labwork. Would hold off on testing until we can talk about it at the August visit. Thanks.

## 2019-07-30 ENCOUNTER — Encounter: Payer: Self-pay | Admitting: Cardiology

## 2019-07-30 ENCOUNTER — Telehealth (INDEPENDENT_AMBULATORY_CARE_PROVIDER_SITE_OTHER): Payer: Medicare Other | Admitting: Cardiology

## 2019-07-30 VITALS — Ht 64.0 in | Wt 150.0 lb

## 2019-07-30 DIAGNOSIS — Z7189 Other specified counseling: Secondary | ICD-10-CM

## 2019-07-30 DIAGNOSIS — I10 Essential (primary) hypertension: Secondary | ICD-10-CM

## 2019-07-30 DIAGNOSIS — E7800 Pure hypercholesterolemia, unspecified: Secondary | ICD-10-CM | POA: Diagnosis not present

## 2019-07-30 DIAGNOSIS — R079 Chest pain, unspecified: Secondary | ICD-10-CM | POA: Diagnosis not present

## 2019-07-30 DIAGNOSIS — I2510 Atherosclerotic heart disease of native coronary artery without angina pectoris: Secondary | ICD-10-CM

## 2019-07-30 DIAGNOSIS — Z712 Person consulting for explanation of examination or test findings: Secondary | ICD-10-CM

## 2019-07-30 NOTE — Progress Notes (Signed)
Virtual Visit via Telephone Note   This visit type was conducted due to national recommendations for restrictions regarding the COVID-19 Pandemic (e.g. social distancing) in an effort to limit this patient's exposure and mitigate transmission in our community.  Due to her co-morbid illnesses, this patient is at least at moderate risk for complications without adequate follow up.  This format is felt to be most appropriate for this patient at this time.  The patient did not have access to video technology/had technical difficulties with video requiring transitioning to audio format only (telephone).  All issues noted in this document were discussed and addressed.  No physical exam could be performed with this format.  Please refer to the patient's chart for her  consent to telehealth for [facility].   The patient was identified using 2 identifiers.  Patient Location: Home Provider Location: Home Office  Date:  07/30/2019   ID:  Cathy Page, DOB May 11, 1950, MRN [id number]  PCP:  Cathy Macadam, MD  Cardiologist:  Cathy Dresser, MD  Referring MD: Cathy Macadam, MD   CC: follow up  History of Present Illness:    Cathy Page is a 69 y.o. female with a hx of hypertension, interstitial cystitis, fibromyalgia who is seen for follow up today. I initially saw her 05/21/19 as a new patient to me at the request of Cathy Macadam, MD for the evaluation and management of racing heart beats. She was last seen by Cathy Page on 11/08/2016 and more remotely had been seen by Cathy Page.  Note from 03/29/19 with Cathy Page reviewed. Patient and her daughter requested cardiology referral for high heart rate with anxiety and chronic chest pain. She was seen by Cathy Page in the past.  Today: Reviewed recent ER visit. Had 4 hours of chest pressure, but no shortness of breath, diaphoresis. Has had two brief episodes since then, but not as severe. ER doctor said that she should  discuss further testing with me.   That episode started when she was awake but laying down, central chest tightness/pressure. Tried to watch TV until it went away. Tried to avoid going to ER, but when it didn't stop she woke up her husband and went to ER. Had mild nausea, though this is intermittent anyway. She does not know what she had for dinner that night, they didn't ask in ER. No prior GERD that she knows of. Has had intermittent GI issues in the past with pain from stomach to throat, even without eating anything. Rare but has happened in the past.  Per ER visit, hsTnI 4, 4.   Reviewed her testing. I discussed that her CT in 11/2016 was reassuring, as was her hsTnI. We spent significant time today reviewing the natural history of plaque progression, plaque rupture as cause for acute coronary syndrome, sensitivity and specificity of hsTnI testing, and options for testing/management. She also had questions re: statins, which we discussed at length. She had previously not wanted to be on statins given some of her personal research findings, but she was amenable to trying at our initial visit. Reviewed the long term benefits of statins.  Denies shortness of breath at rest or with normal exertion. No PND, orthopnea, LE edema or unexpected weight gain. No syncope or recent palpitations.  Past Medical History:  Diagnosis Date  . Anxiety   . Bilateral renal cysts    simple  . Diverticulosis   . Endometrial polyp   . Fibromyalgia   . GERD (gastroesophageal reflux  disease)   . History of palpitations    cardiologist consult 2013 w/ dr Tressia Miners turner-- felt to be due to increasing thyroid medication on her own  . Hx of leukocytosis 11/30/2012  . Hyperlipidemia   . Hypertension   . Interstitial cystitis    Chronic, intermittent  . Pre-diabetes   . Type 2 diabetes mellitus ([place])   . Wears partial dentures    lower    Past Surgical History:  Procedure Laterality Date  . CARDIOVASCULAR STRESS  TEST  01/21/2016   normal nuclear study w/ no ischemia/  normal LV function and wall motion , stress ef 74%  . CESAREAN SECTION  x3  last one 1980   BILATERAL TUBAL LIGATION  W/ LAST C/S  . COLONOSCOPY  last one 2014  . D & C  FAILED HYSTEROSCOPY  04-23-2003  dr Bing Matter   severe anteverted uterus  . DILATATION & CURETTAGE/HYSTEROSCOPY WITH MYOSURE N/A 01/27/2016   Procedure: DILATATION & CURETTAGE/HYSTEROSCOPY WITH MYOSURE AND ENDOMETRIAL CURETTAGE;  Surgeon: Tyson Dense, MD;  Location: [place];  Service: Gynecology;  Laterality: N/A;  . EXPLORATORY LAPAROTOMY/  OVARIAN CYSTECTOMY  1976   w/  APPENDECTOMY    Current Medications: Current Outpatient Medications on File Prior to Visit  Medication Sig  . ALPRAZolam (XANAX) 1 MG tablet Take 0.5 mg by mouth 2 (two) times daily as needed for anxiety.  . Ascorbic Acid (VITAMIN C) 1000 MG tablet Take 1,000 mg by mouth daily.   Marland Kitchen aspirin EC 81 MG tablet Take 81 mg by mouth daily. Swallow whole.  . Cholecalciferol (VITAMIN D-3) 1000 UNITS CAPS Take 2,000 Units by mouth daily.   . Coenzyme Q10 (CO Q 10 PO) Take 2 tablets by mouth daily.   . diphenhydrAMINE (BENADRYL) 25 mg capsule Take 50 mg by mouth daily as needed for allergies.   . fish oil-omega-3 fatty acids 1000 MG capsule Take 3 g by mouth daily.   Marland Kitchen levothyroxine (SYNTHROID, LEVOTHROID) 50 MCG tablet Take 1 tablet daily by mouth.  . liothyronine (CYTOMEL) 5 MCG tablet Take 10 mcg by mouth daily.   Marland Kitchen lisinopril (ZESTRIL) 5 MG tablet Take 5 mg by mouth daily.   . Multiple Vitamins-Minerals (MULTIVITAMIN PO) Take 1 tablet by mouth daily.  . pravastatin (PRAVACHOL) 20 MG tablet Take 1 tablet (20 mg total) by mouth every evening.  . sertraline (ZOLOFT) 50 MG tablet Take 50 mg by mouth daily.  . Zinc Gluconate 30 MG TABS Take 30 mg by mouth daily.    No current facility-administered medications on file prior to visit.     Allergies:   Sulfa antibiotics, Codeine,  and Hydrocodone   Social History   Tobacco Use  . Smoking status: Former Smoker    Packs/day: 1.00    Years: 12.00    Pack years: 12.00    Types: Cigarettes    Quit date: 01/12/1983    Years since quitting: 36.5  . Smokeless tobacco: Never Used  Vaping Use  . Vaping Use: Never used  Substance Use Topics  . Alcohol use: Yes    Comment: OCCASIONAL  . Drug use: No    Family History: family history includes CAD in her mother; Diabetes in her father. mother died of MI, had 4V CABG 10 years prior in her early 61s. Grandfather has MI in his early 83s. Father had MI, but father's side not as bad as mother's side.  ROS:   Please see the history of present illness.  Additional pertinent ROS otherwise unremarkable.  EKGs/Labs/Other Studies Reviewed:    The following studies were reviewed today: MPI 04-07-2012 Normal stress test  EKG:  EKG is personally reviewed.  The ekg ordered 07/14/19 demonstrates NSR at 78 bpm  Recent Labs: 07/14/2019: BUN 25; Creatinine, Ser 0.61; Hemoglobin 12.6; Platelets 260; Potassium 3.9; Sodium 136  Recent Lipid Panel    Component Value Date/Time   CHOL 283 (H) 05/21/2019 1559   TRIG 228 (H) 05/21/2019 1559   HDL 64 05/21/2019 1559   CHOLHDL 4.4 05/21/2019 1559   LDLCALC 176 (H) 05/21/2019 1559    Physical Exam:    VS:  Ht 5\' 4"  (1.626 m)   Wt 150 lb (68 kg)   BMI 25.75 kg/m     Wt Readings from Last 3 Encounters:  07/30/19 150 lb (68 kg)  05/21/19 148 lb (67.1 kg)  02/06/19 143 lb (64.9 kg)    Speaking comfortably on the phone, no audible wheezing In no acute distress Alert and oriented Normal affect Normal speech    ASSESSMENT:    1. Chest pain, unspecified type   2. Nonocclusive coronary atherosclerosis of native coronary artery   3. Hypercholesterolemia   4. Essential hypertension   5. Cardiac risk counseling   6. Counseling on health promotion and disease prevention   7. Encounter to discuss test results    PLAN:    Recent ER  visit for chest pain: -discussed at length. Low suspicion for cardiac etiology given nonobstructive disease on CT cardiac in 11/2016 and normal troponins (high sensitivity). Also occurred at rest, while reclining. She is unsure what she ate for dinner that night -discussed multiple potential causes of chest pain at length -I recommended she discuss with her PCP. She wants to avoid medications and especially does not want to go on PPI. I recommended a trial of an antacid if this occurs again to see if there is any change, which would support GI etiology -she understands red flag signs that need emergency medical attention -discussed additional testing. After shared decision making, will re-evaluate based on symptoms in one month.  Hyperlipidemia: -started pravastatin 05/2019 after discussion -recheck lipids/LFTs at follow up  Hypertension: -goal of <130/80 today, no reading today -continue lisinopril daily  Nonobstructive CAD: based on prior reports. Asymptomatic today -continue statin, as above.  History of palpitations: -ECG unremarkable -has not recurred  Cardiac risk counseling and prevention recommendations: -recommend heart healthy/Mediterranean diet, with whole grains, fruits, vegetable, fish, lean meats, nuts, and olive oil. Limit salt. -recommend moderate walking, 3-5 times/week for 30-50 minutes each session. Aim for at least 150 minutes.week. Goal should be pace of 3 miles/hours, or walking 1.5 miles in 30 minutes -recommend avoidance of tobacco products. Avoid excess alcohol. -ASCVD risk score: The 10-year ASCVD risk score Mikey Bussing DC Brooke Bonito., et al., 04/08/11) is: 17.8%   Values used to calculate the score:     Age: 29 years     Sex: Female     Is Non-Hispanic African American: No     Diabetic: Yes     Tobacco smoker: No     Systolic Blood Pressure: 182 mmHg     Is BP treated: Yes     HDL Cholesterol: 64 mg/dL     Total Cholesterol: 283 mg/dL    Plan for follow up: keep appt  scheduled for next month  Today, I have spent 34 minutes with the patient with telehealth technology discussing the above problems.  Additional time spent in chart review,  documentation, and communication. Total time of visit 50 minutes.  Cathy Dresser, MD, PhD [place]  [facility]    Medication Adjustments/Labs and Tests Ordered: Current medicines are reviewed at length with the patient today.  Concerns regarding medicines are outlined above.  No orders of the defined types were placed in this encounter.  No orders of the defined types were placed in this encounter.   Patient Instructions  Medication Instructions:  Your Physician recommend you continue on your current medication as directed.    *If you need a refill on your cardiac medications before your next appointment, please call your pharmacy*   Lab Work: None   Testing/Procedures: None   Follow-Up: At [facility], you and your health needs are our priority.  As part of our continuing mission to provide you with exceptional heart care, we have created designated Provider Care Teams.  These Care Teams include your primary Cardiologist (physician) and Advanced Practice Providers (APPs -  Physician Assistants and Nurse Practitioners) who all work together to provide you with the care you need, when you need it.  We recommend signing up for the patient portal called "MyChart".  Sign up information is provided on this After Visit Summary.  MyChart is used to connect with patients for Virtual Visits (Telemedicine).  Patients are able to view lab/test results, encounter notes, upcoming appointments, etc.  Non-urgent messages can be sent to your provider as well.   To learn more about what you can do with MyChart, go to [email or web address].    Your next appointment:   August 22, 2019 @ 3:40 pm  The format for your next appointment:   In Person  Provider:   Buford Dresser, MD        Signed, Cathy Dresser, MD PhD 07/30/2019  [place]

## 2019-07-30 NOTE — Patient Instructions (Signed)
Medication Instructions:  Your Physician recommend you continue on your current medication as directed.    *If you need a refill on your cardiac medications before your next appointment, please call your pharmacy*   Lab Work: None   Testing/Procedures: None   Follow-Up: At [facility], you and your health needs are our priority.  As part of our continuing mission to provide you with exceptional heart care, we have created designated Provider Care Teams.  These Care Teams include your primary Cardiologist (physician) and Advanced Practice Providers (APPs -  Physician Assistants and Nurse Practitioners) who all work together to provide you with the care you need, when you need it.  We recommend signing up for the patient portal called "MyChart".  Sign up information is provided on this After Visit Summary.  MyChart is used to connect with patients for Virtual Visits (Telemedicine).  Patients are able to view lab/test results, encounter notes, upcoming appointments, etc.  Non-urgent messages can be sent to your provider as well.   To learn more about what you can do with MyChart, go to [email or web address].    Your next appointment:   August 22, 2019 @ 3:40 pm  The format for your next appointment:   In Person  Provider:   Buford Dresser, MD

## 2019-08-22 ENCOUNTER — Ambulatory Visit: Payer: Medicare Other | Admitting: Cardiology

## 2019-11-01 IMAGING — US US ABDOMEN COMPLETE
1 series · 14 of 25 positions shown · non-contrast
Comparison: None.

CLINICAL DATA: Right upper quadrant abdominal pain for 6 weeks.

EXAM:
ABDOMEN ULTRASOUND COMPLETE

[Series 1: us abdomen complete · 0.28mm/px · 14 of 110 slices shown]
[im 1/110]
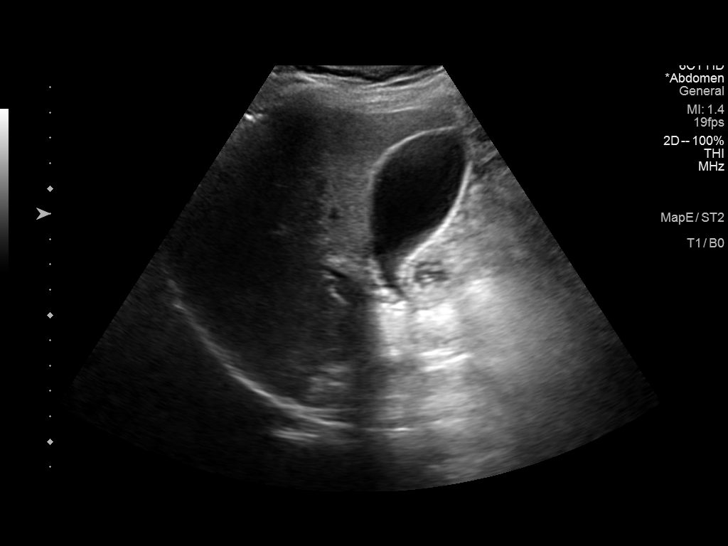
[im 10/110]
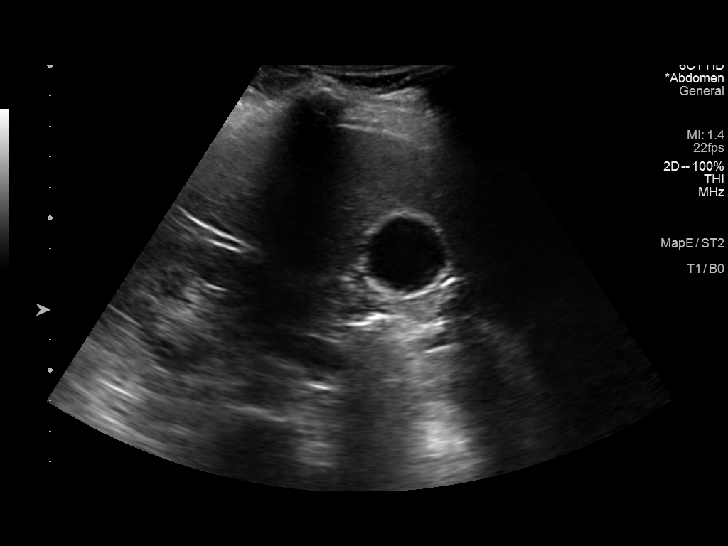
[im 19/110]
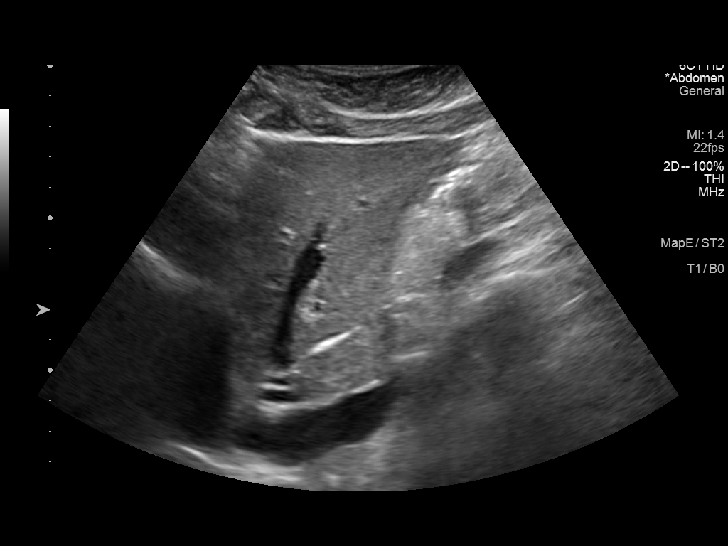
[im 28/110]
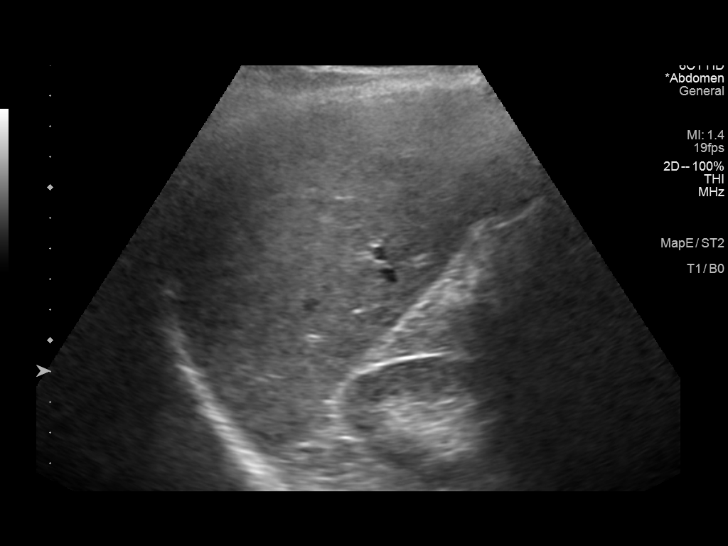
[im 37/110]
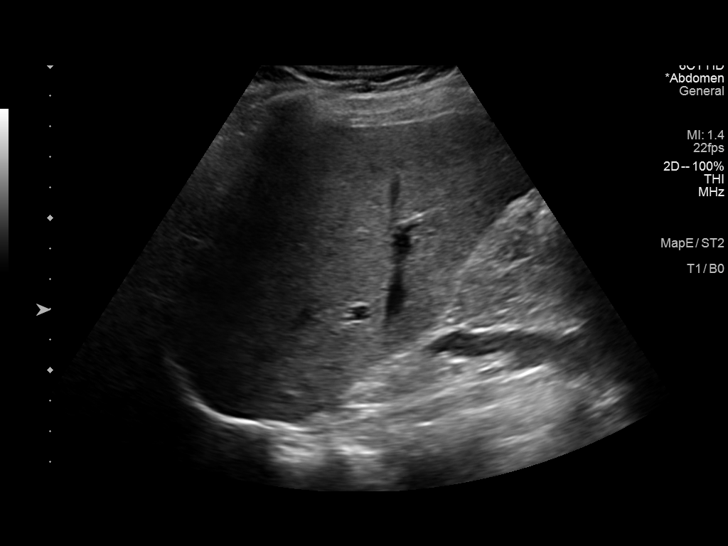
[im 41/110]
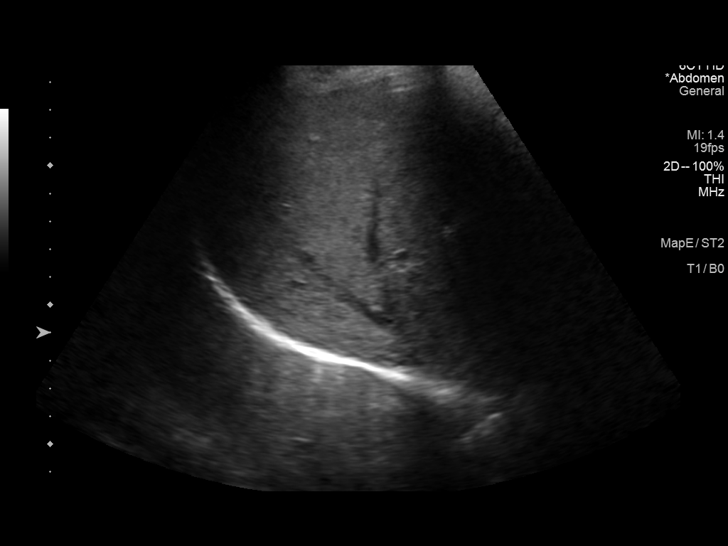
[im 50/110]
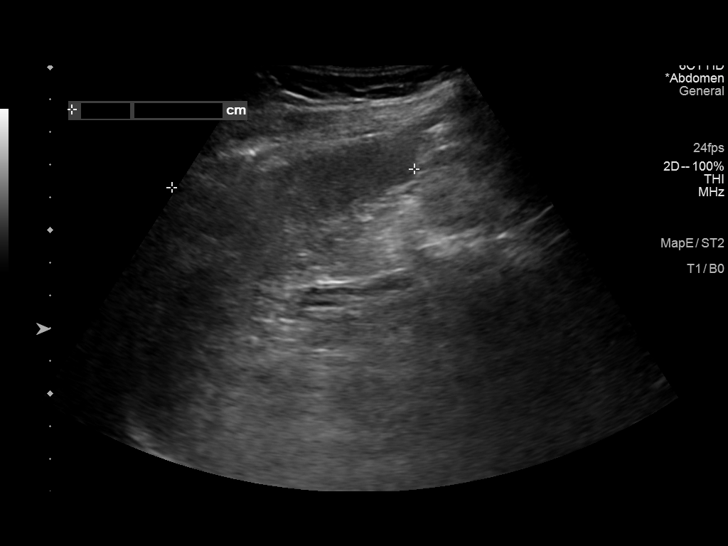
[im 60/110]
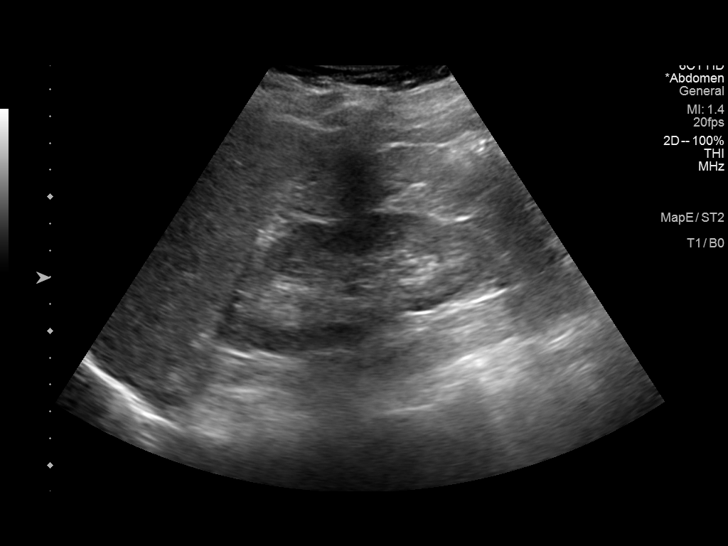
[im 69/110]
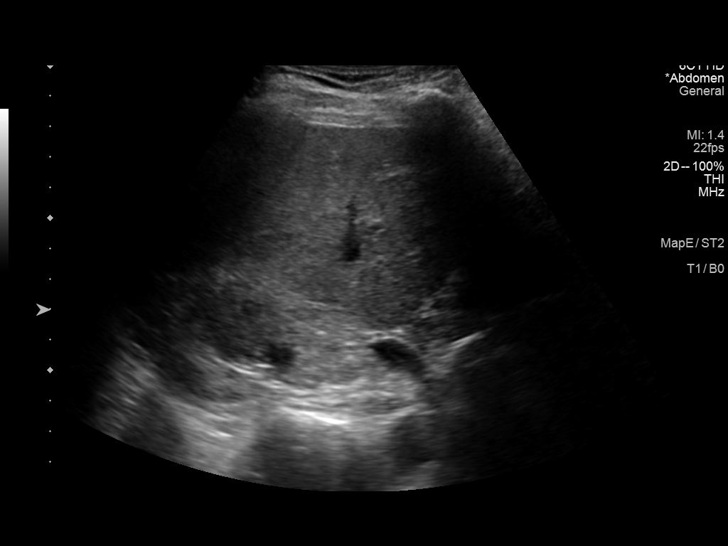
[im 73/110]
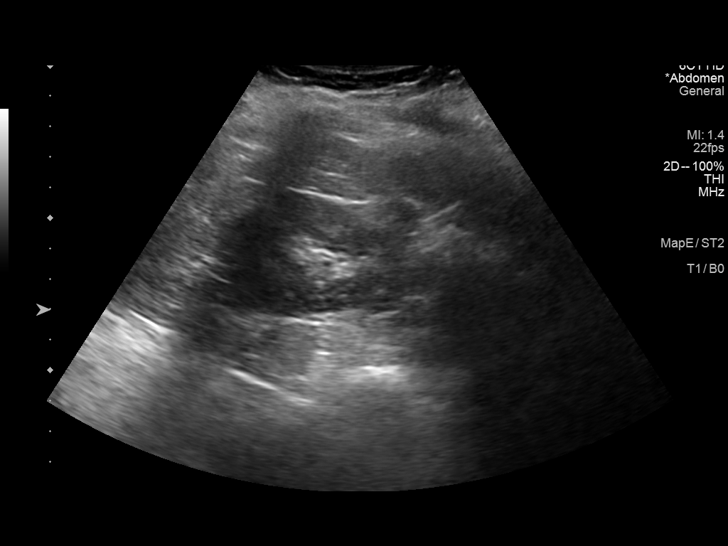
[im 82/110]
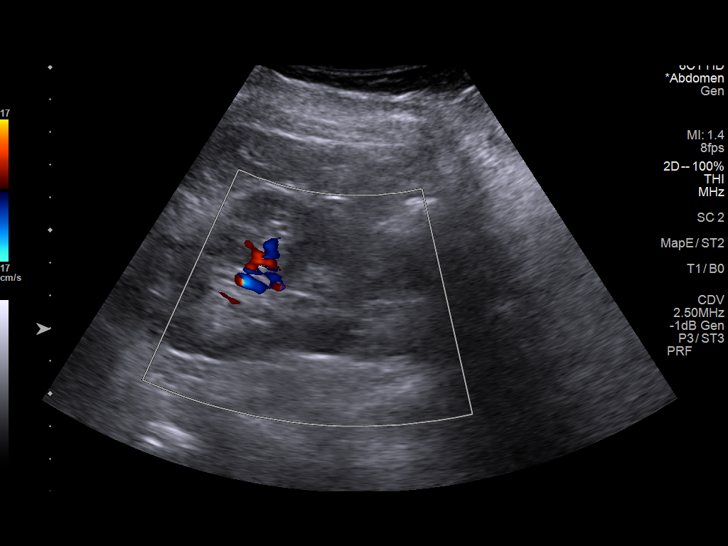
[im 91/110]
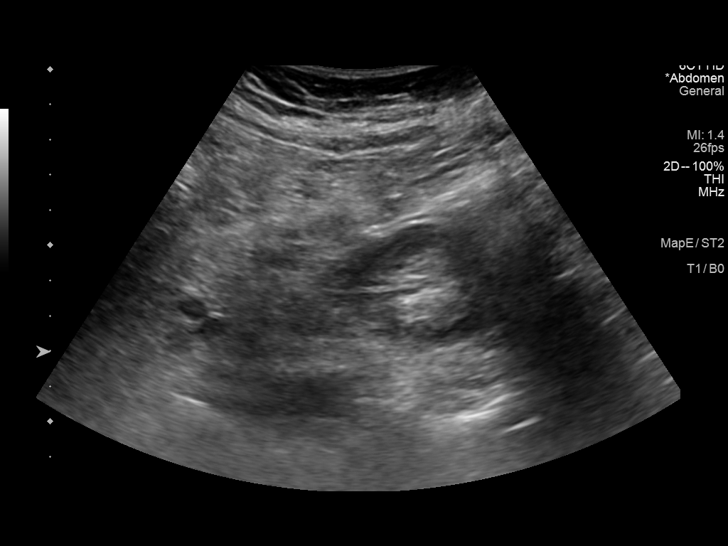
[im 100/110]
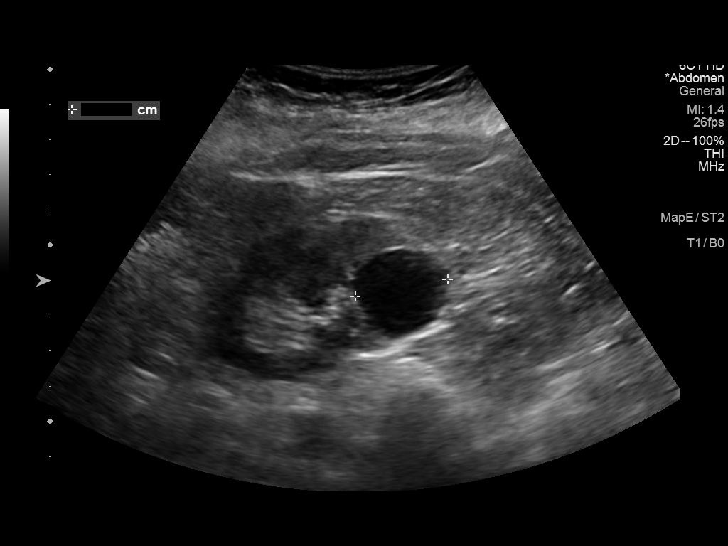
[im 110/110]
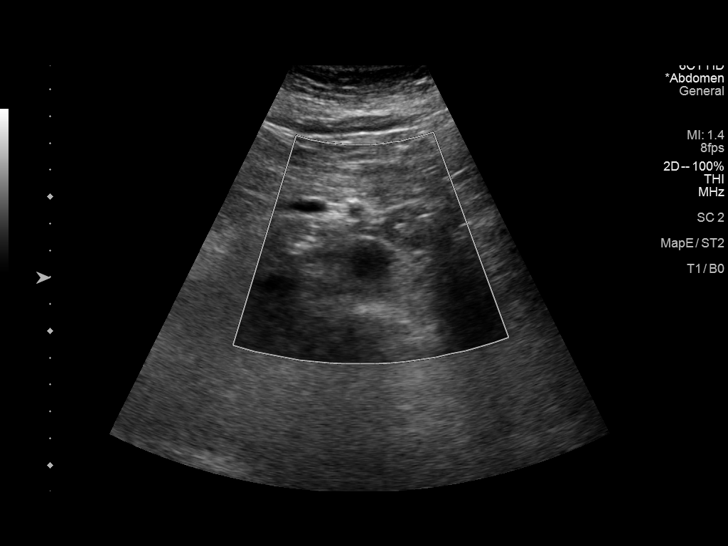

[14 of 25 positions shown; findings below may reference images not displayed]

FINDINGS: Gallbladder: No gallstones or wall thickening visualized. No
sonographic Murphy sign noted by sonographer.

Common bile duct: Diameter: 2.8 mm

Liver: Normal echogenicity without focal lesion or biliary
dilatation. Portal vein is patent on color Doppler imaging with
normal direction of blood flow towards the liver.

IVC: Normal caliber

Pancreas: Sonographically unremarkable.

Spleen: Normal size.  No focal lesions.

Right Kidney: Length: 10.8 cm. Normal renal cortical thickness and
echogenicity without hydronephrosis. There is a 1.1 x 1.0 x 1.0 cm
cyst associated with the upper pole region.

Left Kidney: Length: 10.7 cm. Normal renal cortical thickness and
echogenicity without hydronephrosis. There are 2 simple appearing
renal cysts. The midpole cyst measures 2.6 x 2.6 x 2.7 cm in the
lower pole cyst measures 3.3 x 2.5 x 2.7 cm.

Abdominal aorta: Normal caliber.

Other findings: None.
IMPRESSION: 1. Unremarkable abdominal ultrasound examination.
2. Simple appearing bilateral renal cysts.

## 2020-02-07 DIAGNOSIS — Z20822 Contact with and (suspected) exposure to covid-19: Secondary | ICD-10-CM | POA: Diagnosis not present

## 2020-02-08 DIAGNOSIS — I10 Essential (primary) hypertension: Secondary | ICD-10-CM | POA: Diagnosis not present

## 2020-02-08 DIAGNOSIS — E7800 Pure hypercholesterolemia, unspecified: Secondary | ICD-10-CM | POA: Diagnosis not present

## 2020-02-08 DIAGNOSIS — E1169 Type 2 diabetes mellitus with other specified complication: Secondary | ICD-10-CM | POA: Diagnosis not present

## 2020-02-08 DIAGNOSIS — E039 Hypothyroidism, unspecified: Secondary | ICD-10-CM | POA: Diagnosis not present

## 2020-02-08 DIAGNOSIS — G4700 Insomnia, unspecified: Secondary | ICD-10-CM | POA: Diagnosis not present

## 2020-02-15 DIAGNOSIS — G4700 Insomnia, unspecified: Secondary | ICD-10-CM | POA: Diagnosis not present

## 2020-02-15 DIAGNOSIS — U071 COVID-19: Secondary | ICD-10-CM | POA: Diagnosis not present

## 2020-03-12 DIAGNOSIS — H02836 Dermatochalasis of left eye, unspecified eyelid: Secondary | ICD-10-CM | POA: Diagnosis not present

## 2020-03-12 DIAGNOSIS — H02833 Dermatochalasis of right eye, unspecified eyelid: Secondary | ICD-10-CM | POA: Diagnosis not present

## 2020-03-12 DIAGNOSIS — Z961 Presence of intraocular lens: Secondary | ICD-10-CM | POA: Diagnosis not present

## 2020-03-12 DIAGNOSIS — H40023 Open angle with borderline findings, high risk, bilateral: Secondary | ICD-10-CM | POA: Diagnosis not present

## 2020-03-13 DIAGNOSIS — Z4689 Encounter for fitting and adjustment of other specified devices: Secondary | ICD-10-CM | POA: Diagnosis not present

## 2020-03-14 DIAGNOSIS — E039 Hypothyroidism, unspecified: Secondary | ICD-10-CM | POA: Diagnosis not present

## 2020-03-14 DIAGNOSIS — I10 Essential (primary) hypertension: Secondary | ICD-10-CM | POA: Diagnosis not present

## 2020-03-14 DIAGNOSIS — G4700 Insomnia, unspecified: Secondary | ICD-10-CM | POA: Diagnosis not present

## 2020-03-14 DIAGNOSIS — E7800 Pure hypercholesterolemia, unspecified: Secondary | ICD-10-CM | POA: Diagnosis not present

## 2020-03-14 DIAGNOSIS — E1169 Type 2 diabetes mellitus with other specified complication: Secondary | ICD-10-CM | POA: Diagnosis not present

## 2020-03-17 DIAGNOSIS — L814 Other melanin hyperpigmentation: Secondary | ICD-10-CM | POA: Diagnosis not present

## 2020-03-17 DIAGNOSIS — D2261 Melanocytic nevi of right upper limb, including shoulder: Secondary | ICD-10-CM | POA: Diagnosis not present

## 2020-03-17 DIAGNOSIS — L821 Other seborrheic keratosis: Secondary | ICD-10-CM | POA: Diagnosis not present

## 2020-03-17 DIAGNOSIS — D225 Melanocytic nevi of trunk: Secondary | ICD-10-CM | POA: Diagnosis not present

## 2020-03-17 DIAGNOSIS — D1801 Hemangioma of skin and subcutaneous tissue: Secondary | ICD-10-CM | POA: Diagnosis not present

## 2020-04-28 DIAGNOSIS — R350 Frequency of micturition: Secondary | ICD-10-CM | POA: Diagnosis not present

## 2020-05-14 DIAGNOSIS — E1169 Type 2 diabetes mellitus with other specified complication: Secondary | ICD-10-CM | POA: Diagnosis not present

## 2020-05-14 DIAGNOSIS — G4700 Insomnia, unspecified: Secondary | ICD-10-CM | POA: Diagnosis not present

## 2020-05-14 DIAGNOSIS — E7800 Pure hypercholesterolemia, unspecified: Secondary | ICD-10-CM | POA: Diagnosis not present

## 2020-05-14 DIAGNOSIS — I10 Essential (primary) hypertension: Secondary | ICD-10-CM | POA: Diagnosis not present

## 2020-05-14 DIAGNOSIS — E039 Hypothyroidism, unspecified: Secondary | ICD-10-CM | POA: Diagnosis not present

## 2020-06-06 DIAGNOSIS — R319 Hematuria, unspecified: Secondary | ICD-10-CM | POA: Diagnosis not present

## 2020-06-12 DIAGNOSIS — I10 Essential (primary) hypertension: Secondary | ICD-10-CM | POA: Diagnosis not present

## 2020-06-12 DIAGNOSIS — E538 Deficiency of other specified B group vitamins: Secondary | ICD-10-CM | POA: Diagnosis not present

## 2020-06-12 DIAGNOSIS — Z789 Other specified health status: Secondary | ICD-10-CM | POA: Diagnosis not present

## 2020-06-12 DIAGNOSIS — G479 Sleep disorder, unspecified: Secondary | ICD-10-CM | POA: Diagnosis not present

## 2020-06-12 DIAGNOSIS — E039 Hypothyroidism, unspecified: Secondary | ICD-10-CM | POA: Diagnosis not present

## 2020-06-12 DIAGNOSIS — E1169 Type 2 diabetes mellitus with other specified complication: Secondary | ICD-10-CM | POA: Diagnosis not present

## 2020-06-12 DIAGNOSIS — E7800 Pure hypercholesterolemia, unspecified: Secondary | ICD-10-CM | POA: Diagnosis not present

## 2020-06-19 DIAGNOSIS — H57813 Brow ptosis, bilateral: Secondary | ICD-10-CM | POA: Diagnosis not present

## 2020-06-19 DIAGNOSIS — H02834 Dermatochalasis of left upper eyelid: Secondary | ICD-10-CM | POA: Diagnosis not present

## 2020-06-19 DIAGNOSIS — H02401 Unspecified ptosis of right eyelid: Secondary | ICD-10-CM | POA: Diagnosis not present

## 2020-06-19 DIAGNOSIS — H02831 Dermatochalasis of right upper eyelid: Secondary | ICD-10-CM | POA: Diagnosis not present

## 2020-07-16 DIAGNOSIS — E7800 Pure hypercholesterolemia, unspecified: Secondary | ICD-10-CM | POA: Diagnosis not present

## 2020-07-16 DIAGNOSIS — G4700 Insomnia, unspecified: Secondary | ICD-10-CM | POA: Diagnosis not present

## 2020-07-16 DIAGNOSIS — I10 Essential (primary) hypertension: Secondary | ICD-10-CM | POA: Diagnosis not present

## 2020-07-16 DIAGNOSIS — E039 Hypothyroidism, unspecified: Secondary | ICD-10-CM | POA: Diagnosis not present

## 2020-08-28 DIAGNOSIS — Z1231 Encounter for screening mammogram for malignant neoplasm of breast: Secondary | ICD-10-CM | POA: Diagnosis not present

## 2020-08-28 DIAGNOSIS — R300 Dysuria: Secondary | ICD-10-CM | POA: Insufficient documentation

## 2020-09-03 DIAGNOSIS — H02831 Dermatochalasis of right upper eyelid: Secondary | ICD-10-CM | POA: Diagnosis not present

## 2020-09-03 DIAGNOSIS — H02401 Unspecified ptosis of right eyelid: Secondary | ICD-10-CM | POA: Diagnosis not present

## 2020-09-03 DIAGNOSIS — H02834 Dermatochalasis of left upper eyelid: Secondary | ICD-10-CM | POA: Diagnosis not present

## 2020-09-18 DIAGNOSIS — Z4689 Encounter for fitting and adjustment of other specified devices: Secondary | ICD-10-CM | POA: Diagnosis not present

## 2020-11-06 DIAGNOSIS — H40023 Open angle with borderline findings, high risk, bilateral: Secondary | ICD-10-CM | POA: Diagnosis not present

## 2020-11-07 DIAGNOSIS — Z48810 Encounter for surgical aftercare following surgery on the sense organs: Secondary | ICD-10-CM | POA: Diagnosis not present

## 2020-11-07 DIAGNOSIS — H029 Unspecified disorder of eyelid: Secondary | ICD-10-CM | POA: Diagnosis not present

## 2020-12-19 DIAGNOSIS — E538 Deficiency of other specified B group vitamins: Secondary | ICD-10-CM | POA: Diagnosis not present

## 2020-12-19 DIAGNOSIS — R7303 Prediabetes: Secondary | ICD-10-CM | POA: Diagnosis not present

## 2020-12-19 DIAGNOSIS — N3010 Interstitial cystitis (chronic) without hematuria: Secondary | ICD-10-CM | POA: Diagnosis not present

## 2020-12-19 DIAGNOSIS — I10 Essential (primary) hypertension: Secondary | ICD-10-CM | POA: Diagnosis not present

## 2020-12-19 DIAGNOSIS — E039 Hypothyroidism, unspecified: Secondary | ICD-10-CM | POA: Diagnosis not present

## 2020-12-19 DIAGNOSIS — E7800 Pure hypercholesterolemia, unspecified: Secondary | ICD-10-CM | POA: Diagnosis not present

## 2020-12-19 DIAGNOSIS — Z0000 Encounter for general adult medical examination without abnormal findings: Secondary | ICD-10-CM | POA: Diagnosis not present

## 2021-01-09 DIAGNOSIS — R319 Hematuria, unspecified: Secondary | ICD-10-CM | POA: Diagnosis not present

## 2021-01-09 DIAGNOSIS — Z4689 Encounter for fitting and adjustment of other specified devices: Secondary | ICD-10-CM | POA: Diagnosis not present

## 2021-01-09 DIAGNOSIS — R309 Painful micturition, unspecified: Secondary | ICD-10-CM | POA: Diagnosis not present

## 2021-02-10 DIAGNOSIS — E1169 Type 2 diabetes mellitus with other specified complication: Secondary | ICD-10-CM | POA: Diagnosis not present

## 2021-02-10 DIAGNOSIS — I10 Essential (primary) hypertension: Secondary | ICD-10-CM | POA: Diagnosis not present

## 2021-02-10 DIAGNOSIS — E7800 Pure hypercholesterolemia, unspecified: Secondary | ICD-10-CM | POA: Diagnosis not present

## 2021-02-10 DIAGNOSIS — E039 Hypothyroidism, unspecified: Secondary | ICD-10-CM | POA: Diagnosis not present

## 2021-02-10 DIAGNOSIS — G4700 Insomnia, unspecified: Secondary | ICD-10-CM | POA: Diagnosis not present

## 2021-02-27 DIAGNOSIS — N3020 Other chronic cystitis without hematuria: Secondary | ICD-10-CM | POA: Diagnosis not present

## 2021-02-27 DIAGNOSIS — N3010 Interstitial cystitis (chronic) without hematuria: Secondary | ICD-10-CM | POA: Diagnosis not present

## 2021-03-17 DIAGNOSIS — M79671 Pain in right foot: Secondary | ICD-10-CM | POA: Diagnosis not present

## 2021-03-17 DIAGNOSIS — M79672 Pain in left foot: Secondary | ICD-10-CM | POA: Diagnosis not present

## 2021-03-18 DIAGNOSIS — I788 Other diseases of capillaries: Secondary | ICD-10-CM | POA: Diagnosis not present

## 2021-03-18 DIAGNOSIS — L821 Other seborrheic keratosis: Secondary | ICD-10-CM | POA: Diagnosis not present

## 2021-03-18 DIAGNOSIS — D2261 Melanocytic nevi of right upper limb, including shoulder: Secondary | ICD-10-CM | POA: Diagnosis not present

## 2021-03-18 DIAGNOSIS — L814 Other melanin hyperpigmentation: Secondary | ICD-10-CM | POA: Diagnosis not present

## 2021-03-18 DIAGNOSIS — L243 Irritant contact dermatitis due to cosmetics: Secondary | ICD-10-CM | POA: Diagnosis not present

## 2021-03-18 DIAGNOSIS — L720 Epidermal cyst: Secondary | ICD-10-CM | POA: Diagnosis not present

## 2021-05-08 DIAGNOSIS — M79671 Pain in right foot: Secondary | ICD-10-CM | POA: Diagnosis not present

## 2021-06-19 DIAGNOSIS — I10 Essential (primary) hypertension: Secondary | ICD-10-CM | POA: Diagnosis not present

## 2021-06-19 DIAGNOSIS — F411 Generalized anxiety disorder: Secondary | ICD-10-CM | POA: Diagnosis not present

## 2021-06-19 DIAGNOSIS — E559 Vitamin D deficiency, unspecified: Secondary | ICD-10-CM | POA: Diagnosis not present

## 2021-06-19 DIAGNOSIS — E039 Hypothyroidism, unspecified: Secondary | ICD-10-CM | POA: Diagnosis not present

## 2021-06-19 DIAGNOSIS — R399 Unspecified symptoms and signs involving the genitourinary system: Secondary | ICD-10-CM | POA: Diagnosis not present

## 2021-06-19 DIAGNOSIS — E7800 Pure hypercholesterolemia, unspecified: Secondary | ICD-10-CM | POA: Diagnosis not present

## 2021-06-19 DIAGNOSIS — E538 Deficiency of other specified B group vitamins: Secondary | ICD-10-CM | POA: Diagnosis not present

## 2021-06-19 DIAGNOSIS — R7303 Prediabetes: Secondary | ICD-10-CM | POA: Diagnosis not present

## 2021-06-25 DIAGNOSIS — E875 Hyperkalemia: Secondary | ICD-10-CM | POA: Diagnosis not present

## 2021-07-03 DIAGNOSIS — M79675 Pain in left toe(s): Secondary | ICD-10-CM | POA: Diagnosis not present

## 2021-09-15 DIAGNOSIS — R82998 Other abnormal findings in urine: Secondary | ICD-10-CM | POA: Diagnosis not present

## 2021-09-17 DIAGNOSIS — M9901 Segmental and somatic dysfunction of cervical region: Secondary | ICD-10-CM | POA: Diagnosis not present

## 2021-09-17 DIAGNOSIS — M50323 Other cervical disc degeneration at C6-C7 level: Secondary | ICD-10-CM | POA: Diagnosis not present

## 2021-09-21 DIAGNOSIS — N952 Postmenopausal atrophic vaginitis: Secondary | ICD-10-CM | POA: Diagnosis not present

## 2021-09-21 DIAGNOSIS — N281 Cyst of kidney, acquired: Secondary | ICD-10-CM | POA: Diagnosis not present

## 2021-09-21 DIAGNOSIS — R82998 Other abnormal findings in urine: Secondary | ICD-10-CM | POA: Diagnosis not present

## 2021-09-21 DIAGNOSIS — M9901 Segmental and somatic dysfunction of cervical region: Secondary | ICD-10-CM | POA: Diagnosis not present

## 2021-09-21 DIAGNOSIS — N3010 Interstitial cystitis (chronic) without hematuria: Secondary | ICD-10-CM | POA: Diagnosis not present

## 2021-09-21 DIAGNOSIS — N813 Complete uterovaginal prolapse: Secondary | ICD-10-CM | POA: Diagnosis not present

## 2021-09-21 DIAGNOSIS — Z87440 Personal history of urinary (tract) infections: Secondary | ICD-10-CM | POA: Diagnosis not present

## 2021-09-21 DIAGNOSIS — R300 Dysuria: Secondary | ICD-10-CM | POA: Diagnosis not present

## 2021-09-21 DIAGNOSIS — M50323 Other cervical disc degeneration at C6-C7 level: Secondary | ICD-10-CM | POA: Diagnosis not present

## 2021-09-22 DIAGNOSIS — Z961 Presence of intraocular lens: Secondary | ICD-10-CM | POA: Diagnosis not present

## 2021-09-22 DIAGNOSIS — H26493 Other secondary cataract, bilateral: Secondary | ICD-10-CM | POA: Diagnosis not present

## 2021-09-22 DIAGNOSIS — H40053 Ocular hypertension, bilateral: Secondary | ICD-10-CM | POA: Diagnosis not present

## 2021-09-22 DIAGNOSIS — H40023 Open angle with borderline findings, high risk, bilateral: Secondary | ICD-10-CM | POA: Diagnosis not present

## 2021-09-23 ENCOUNTER — Encounter (HOSPITAL_BASED_OUTPATIENT_CLINIC_OR_DEPARTMENT_OTHER): Payer: Self-pay | Admitting: Cardiology

## 2021-09-23 ENCOUNTER — Ambulatory Visit (INDEPENDENT_AMBULATORY_CARE_PROVIDER_SITE_OTHER): Payer: No Typology Code available for payment source | Admitting: Cardiology

## 2021-09-23 VITALS — BP 112/70 | HR 86 | Ht 64.0 in | Wt 137.6 lb

## 2021-09-23 DIAGNOSIS — Z7189 Other specified counseling: Secondary | ICD-10-CM | POA: Diagnosis not present

## 2021-09-23 DIAGNOSIS — E7800 Pure hypercholesterolemia, unspecified: Secondary | ICD-10-CM | POA: Diagnosis not present

## 2021-09-23 DIAGNOSIS — I10 Essential (primary) hypertension: Secondary | ICD-10-CM | POA: Diagnosis not present

## 2021-09-23 DIAGNOSIS — I2510 Atherosclerotic heart disease of native coronary artery without angina pectoris: Secondary | ICD-10-CM

## 2021-09-23 DIAGNOSIS — Z79899 Other long term (current) drug therapy: Secondary | ICD-10-CM

## 2021-09-23 MED ORDER — ATORVASTATIN CALCIUM 10 MG PO TABS: 10.0000 mg | ORAL_TABLET | Freq: Every day | ORAL | 3 refills | Status: DC

## 2021-09-23 NOTE — Progress Notes (Signed)
Cardiology Office Note   Date:  09/23/2021   ID:  Cathy Page, DOB [birth year], MRN [id number]  PCP:  Caren Macadam, MD  Cardiologist:  Buford Dresser, MD  Referring MD: Caren Macadam, MD   CC: follow up  History of Present Illness:    Cathy Page is a 71 y.o. female with a hx of hypertension, interstitial cystitis, fibromyalgia who is seen for follow up today. I initially saw her 05/21/19 as a new patient to me at the request of Caren Macadam, MD for the evaluation and management of racing heart beats.   CV history: 03/2019, Patient and her daughter requested cardiology referral for high heart rate with anxiety and chronic chest pain. See initial consult note. 07/2019, had ER visit due to 4 hours of chest pressure, but no shortness of breath, diaphoresis. ER workup unremarkable, hsTnI 4, 4.   Today:  She states she has been pretty good. Her coronary CT from 2018 was discussed. She declines statin, "has done the research," we have discussed extensively previously.   She reports that she sometimes experiences chest pain, but that she does not believe that it is related to her heart.  Additionally, several times in the last month she has noticed swelling on her left ankle.   She reports that sometimes she will only end up eating one meal per day. She states she does not eat many vegetables. She eats grass-fed red meat, chicken, and fish frequently. In the past couple years, she believes she has been eating considerably healthier.  She quit smoking years ago. Even when she used to smoke, it was not very frequently.   She denies any palpitations, shortness of breath, or peripheral edema. No lightheadedness, headaches, syncope, orthopnea, or PND.   Past Medical History:  Diagnosis Date   Anxiety    Bilateral renal cysts    simple   Diverticulosis    Endometrial polyp    Fibromyalgia    GERD (gastroesophageal reflux disease)    History of palpitations     cardiologist consult 2013 w/ dr Tressia Miners turner-- felt to be due to increasing thyroid medication on her own   Hx of leukocytosis 11/30/2012   Hyperlipidemia    Hypertension    Interstitial cystitis    Chronic, intermittent   Pre-diabetes    Type 2 diabetes mellitus ([place])    Wears partial dentures    lower    Past Surgical History:  Procedure Laterality Date   CARDIOVASCULAR STRESS TEST  01/21/2016   normal nuclear study w/ no ischemia/  normal LV function and wall motion , stress ef 74%   CESAREAN SECTION  x3  last one 1980   BILATERAL TUBAL LIGATION  W/ LAST C/S   COLONOSCOPY  last one 2014   D & C  FAILED HYSTEROSCOPY  04-23-2003  dr Bing Matter   severe anteverted uterus   DILATATION & CURETTAGE/HYSTEROSCOPY WITH MYOSURE N/A 01/27/2016   Procedure: DILATATION & CURETTAGE/HYSTEROSCOPY WITH MYOSURE AND ENDOMETRIAL CURETTAGE;  Surgeon: Tyson Dense, MD;  Location: [facility];  Service: Gynecology;  Laterality: N/A;   EXPLORATORY LAPAROTOMY/  OVARIAN CYSTECTOMY  1976   w/  APPENDECTOMY    Current Medications: Current Outpatient Medications on File Prior to Visit  Medication Sig   ALPRAZolam (XANAX) 1 MG tablet Take 0.5 mg by mouth 2 (two) times daily as needed for anxiety.   Ascorbic Acid (VITAMIN C) 1000 MG tablet Take 1,000 mg by mouth daily.    aspirin  EC 81 MG tablet Take 81 mg by mouth daily. Swallow whole.   Cholecalciferol (VITAMIN D-3) 1000 UNITS CAPS Take 2,000 Units by mouth daily.    Coenzyme Q10 (CO Q 10 PO) Take 2 tablets by mouth daily.    diphenhydrAMINE (BENADRYL) 25 mg capsule Take 50 mg by mouth daily as needed for allergies.    fish oil-omega-3 fatty acids 1000 MG capsule Take 3 g by mouth daily.    levothyroxine (SYNTHROID, LEVOTHROID) 50 MCG tablet Take 1 tablet daily by mouth.   liothyronine (CYTOMEL) 5 MCG tablet Take 10 mcg by mouth daily.    lisinopril (ZESTRIL) 5 MG tablet Take 5 mg by mouth daily.    Multiple Vitamins-Minerals  (MULTIVITAMIN PO) Take 1 tablet by mouth daily.   pravastatin (PRAVACHOL) 20 MG tablet Take 1 tablet (20 mg total) by mouth every evening.   sertraline (ZOLOFT) 50 MG tablet Take 50 mg by mouth daily.   Zinc Gluconate 30 MG TABS Take 30 mg by mouth daily.    No current facility-administered medications on file prior to visit.     Allergies:   Sulfa antibiotics, Codeine, and Hydrocodone   Social History   Tobacco Use   Smoking status: Former    Packs/day: 1.00    Years: 12.00    Total pack years: 12.00    Types: Cigarettes    Quit date: 01/12/1983    Years since quitting: 38.7   Smokeless tobacco: Never  Vaping Use   Vaping Use: Never used  Substance Use Topics   Alcohol use: Yes    Comment: OCCASIONAL   Drug use: No    Family History: family history includes CAD in her mother; Diabetes in her father. mother died of MI, had 4V CABG 66 years prior in her early 6s. Grandfather has MI in his early 51s. Father had MI, but father's side not as bad as mother's side.  ROS:   Please see the history of present illness.   (+) Infrequent chest pain  (+) Occasional left ankle edema Additional pertinent ROS otherwise unremarkable.  EKGs/Labs/Other Studies Reviewed:    The following studies were reviewed today:  CT Coronary Morph 12/09/2016: FINDINGS: A 120 kV prospective scan was triggered in the descending thoracic aorta at 111 HU's. Axial non-contrast 3 mm slices were carried out through the heart. The data set was analyzed on a dedicated work station and scored using the [place]. Gantry rotation speed was 250 msecs and collimation was .6 mm. 5 mg of iv Metoprolol and 0.8 mg of sl NTG was given. The 3D data set was reconstructed in 5% intervals of the 67-82 % of the R-R cycle. Diastolic phases were analyzed on a dedicated work station using MPR, MIP and VRT modes. The patient received 80 cc of contrast.   Aorta:  Normal size.  No calcifications.  No dissection.    Aortic Valve:  Trileaflet.  No calcifications.   Coronary Arteries:  Normal coronary origin.  Right dominance.   RCA is a large dominant artery that gives rise to PDA and PLVB. Mid RCA has a mild calcified plaque with associated stenosis 25-50%.   Left main is a large artery that gives rise to LAD, ramus intermedius and LCX arteries. There is an eccentric calcified plaque at the left main ostium associated with 0-25% stenosis.   LAD is a large vessel that gives rise to one diagonal branch and has no plaque.   RI is a small artery that has no  plaque.   LCX is a very small non-dominant artery that has no plaque.   Other findings:   Normal pulmonary vein drainage into the left atrium.   Normal let atrial appendage without a thrombus.   Normal size of the pulmonary artery.   IMPRESSION: 1. Coronary calcium score of 116. This was 25 percentile for age and sex matched control.   2. Normal coronary origin with right dominance.   3. Mild non-obstructive CAD. Risk factor modification is recommended.   MPI 2014 Normal stress test  EKG:  EKG is personally reviewed.  09/23/21: NSR at 86 bpm 07/14/19: NSR at 78 bpm  Recent Labs: No results found for requested labs within last 365 days.  Recent Lipid Panel    Component Value Date/Time   CHOL 283 (H) 05/21/2019 1559   TRIG 228 (H) 05/21/2019 1559   HDL 64 05/21/2019 1559   CHOLHDL 4.4 05/21/2019 1559   LDLCALC 176 (H) 05/21/2019 1559    Physical Exam:    VS:  BP 112/70 (BP Location: Right Arm, Patient Position: Sitting, Cuff Size: Normal)   Pulse 86   Ht '5\' 4"'$  (1.626 m)   Wt 137 lb 9.6 oz (62.4 kg)   BMI 23.62 kg/m     Wt Readings from Last 3 Encounters:  09/23/21 137 lb 9.6 oz (62.4 kg)  07/30/19 150 lb (68 kg)  05/21/19 148 lb (67.1 kg)    GEN: Well nourished, well developed in no acute distress HEENT: Normal, moist mucous membranes NECK: No JVD CARDIAC: regular rhythm, normal S1 and S2, no rubs or gallops. No  murmur. VASCULAR: Radial and DP pulses 2+ bilaterally. No carotid bruits RESPIRATORY:  Clear to auscultation without rales, wheezing or rhonchi  ABDOMEN: Soft, non-tender, non-distended MUSCULOSKELETAL:  Ambulates independently SKIN: Warm and dry, no edema NEUROLOGIC:  Alert and oriented x 3. No focal neuro deficits noted. PSYCHIATRIC:  Normal affect    ASSESSMENT:    1. Nonocclusive coronary atherosclerosis of native coronary artery   2. Cardiac risk counseling   3. Counseling on health promotion and disease prevention   4. Pure hypercholesterolemia   5. Medication management   6. Essential hypertension     PLAN:    CT with coronary calcium, consistent with nonocclusive CAD Hypercholesterolemia: -she is asking for advanced lipid panel today. I will order NMR panel. We did discuss that the guidelines recommend statins as first line. After extensive discussion, including re-discussion after her husband's visit later in the day, she is willing to try atorvastatin.  -no longer on aspirin per patient preference -reviewed red flag warning signs that need immediate medical attention  Hypertension: -at goal -continue lisinopril daily  History of palpitations: -ECG unremarkable -has not recurred  Cardiac risk counseling and prevention recommendations: -recommend heart healthy/Mediterranean diet, with whole grains, fruits, vegetable, fish, lean meats, nuts, and olive oil. Limit salt. -recommend moderate walking, 3-5 times/week for 30-50 minutes each session. Aim for at least 150 minutes.week. Goal should be pace of 3 miles/hours, or walking 1.5 miles in 30 minutes -recommend avoidance of tobacco products. Avoid excess alcohol. -ASCVD risk score: The 10-year ASCVD risk score (Arnett DK, et al., 2019) is: 30.8%   Values used to calculate the score:     Age: 69 years     Sex: Female     Is Non-Hispanic African American: No     Diabetic: Yes     Tobacco smoker: No     Systolic Blood  Pressure: 132 mmHg  Is BP treated: Yes     HDL Cholesterol: 64 mg/dL     Total Cholesterol: 283 mg/dL    Plan for follow up: 1 year or sooner as needed  Buford Dresser, MD, PhD [place]  [facility]    Total time of encounter: 50 minutes total time of encounter, including 40 minutes spent in face-to-face patient care. This time includes coordination of care and counseling regarding above conditions. Remainder of non-face-to-face time involved reviewing chart documents/testing relevant to the patient encounter and documentation in the medical record.  Medication Adjustments/Labs and Tests Ordered: Current medicines are reviewed at length with the patient today.  Concerns regarding medicines are outlined above.  Orders Placed This Encounter  Procedures   NMR, lipoprofile   Lipid panel   Hepatic function panel   EKG 12-Lead   Meds ordered this encounter  Medications   atorvastatin (LIPITOR) 10 MG tablet    Sig: Take 1 tablet (10 mg total) by mouth daily.    Dispense:  90 tablet    Refill:  3    Patient Instructions  Medication Instructions:  START: Atorvastatin 10 mg daily   *If you need a refill on your cardiac medications before your next appointment, please call your pharmacy*   Lab Work: Your provider has recommended lab work December, 2023 (fasting lipid, LFT). Please have this collected at [facility] at [place]. The lab is open 8:00 am - 4:30 pm. Please avoid 12:00p - 1:00p for lunch hour. You do not need an appointment. Please go to [address] [place], [place] [address]. This is in the Primary Care office on the 3rd floor, let them know you are there for blood work and they will direct you to the lab.  If you have labs (blood work) drawn today and your tests are completely normal, you will receive your results only by: [place] (if you have MyChart) OR A paper copy in the mail If you have any lab test that is  abnormal or we need to change your treatment, we will call you to review the results.   Testing/Procedures: None ordered today    Follow-Up: At [facility], you and your health needs are our priority.  As part of our continuing mission to provide you with exceptional heart care, we have created designated Provider Care Teams.  These Care Teams include your primary Cardiologist (physician) and Advanced Practice Providers (APPs -  Physician Assistants and Nurse Practitioners) who all work together to provide you with the care you need, when you need it.  We recommend signing up for the patient portal called "MyChart".  Sign up information is provided on this After Visit Summary.  MyChart is used to connect with patients for Virtual Visits (Telemedicine).  Patients are able to view lab/test results, encounter notes, upcoming appointments, etc.  Non-urgent messages can be sent to your provider as well.   To learn more about what you can do with MyChart, go to [email or web address].    Your next appointment:   1 year(s)  The format for your next appointment:   In Person  Provider:   Buford Dresser, MD       We discussed the evidence for reduction of heart attack, stroke, or death with statins. I respect that you do not want to take these. We also discussed non-statin therapies, such as ezetimibe and bempedoic acid. It is not recommended to repeat calcium scores once they are abnormal, and you do not  currently meet the guidelines to repeat the coronary CT. We also discussed that if you have severe and/or sustained discomfort, you should seek emergency medical attention.  Recent data, summarized from [place] from a study from the [facility]:  https://wilkins.info/  "Researchers at the [facility] set out to answer this question by comparing  statins to supplements in a clinical trial. They tracked the outcomes of 190 adults, ages 33 to 56. Some participants were given a 5 mg daily dose of rosuvastatin, a statin that is sold under the brand name Crestor for 28 days. Others were given supplements, including fish oil, cinnamon, garlic, turmeric, plant sterols or red yeast rice for the same period."  "What we found was that rosuvastatin lowered LDL cholesterol by almost 38% and that was vastly superior to placebo and any of the six supplements studied in the trial," study Despina Hidden, M.D. of the [facility], [place] told NPR. He says this level of reduction is enough to lower the risk of heart attacks and strokes. The findings are published in the Journal of the [employer] of Cardiology.  "Oftentimes these supplements are marketed as 'natural ways' to lower your cholesterol," says Laffin. But he says none of the dietary supplements demonstrated any significant decrease in LDL cholesterol compared with a placebo. LDL cholesterol is considered the 'bad cholesterol' because it can contribute to plaque build-up in the artery walls - which can narrow the arteries, and set the stage for heart attacks and strokes"     I,Breanna Adamick,acting as a scribe for Buford Dresser, MD.,have documented all relevant documentation on the behalf of Buford Dresser, MD,as directed by  Buford Dresser, MD while in the presence of Buford Dresser, MD.   I, Buford Dresser, MD, have reviewed all documentation for this visit. The documentation on 10/12/21 for the exam, diagnosis, procedures, and orders are all accurate and complete.   Signed, Buford Dresser, MD PhD 09/23/2021  [place]

## 2021-09-23 NOTE — Patient Instructions (Addendum)
Medication Instructions:  START: Atorvastatin 10 mg daily   *If you need a refill on your cardiac medications before your next appointment, please call your pharmacy*   Lab Work: Your provider has recommended lab work December, 2023 (fasting lipid, LFT). Please have this collected at [facility] at [place]. The lab is open 8:00 am - 4:30 pm. Please avoid 12:00p - 1:00p for lunch hour. You do not need an appointment. Please go to [address] [place], [place] [address]. This is in the Primary Care office on the 3rd floor, let them know you are there for blood work and they will direct you to the lab.  If you have labs (blood work) drawn today and your tests are completely normal, you will receive your results only by: [place] (if you have MyChart) OR A paper copy in the mail If you have any lab test that is abnormal or we need to change your treatment, we will call you to review the results.   Testing/Procedures: None ordered today    Follow-Up: At [facility], you and your health needs are our priority.  As part of our continuing mission to provide you with exceptional heart care, we have created designated Provider Care Teams.  These Care Teams include your primary Cardiologist (physician) and Advanced Practice Providers (APPs -  Physician Assistants and Nurse Practitioners) who all work together to provide you with the care you need, when you need it.  We recommend signing up for the patient portal called "MyChart".  Sign up information is provided on this After Visit Summary.  MyChart is used to connect with patients for Virtual Visits (Telemedicine).  Patients are able to view lab/test results, encounter notes, upcoming appointments, etc.  Non-urgent messages can be sent to your provider as well.   To learn more about what you can do with MyChart, go to [email or web address].    Your next appointment:   1 year(s)  The format for your  next appointment:   In Person  Provider:   Buford Dresser, MD       We discussed the evidence for reduction of heart attack, stroke, or death with statins. I respect that you do not want to take these. We also discussed non-statin therapies, such as ezetimibe and bempedoic acid. It is not recommended to repeat calcium scores once they are abnormal, and you do not currently meet the guidelines to repeat the coronary CT. We also discussed that if you have severe and/or sustained discomfort, you should seek emergency medical attention.  Recent data, summarized from [place] from a study from the [facility]:  https://wilkins.info/  "Researchers at the [facility] set out to answer this question by comparing statins to supplements in a clinical trial. They tracked the outcomes of 190 adults, ages 73 to 73. Some participants were given a 5 mg daily dose of rosuvastatin, a statin that is sold under the brand name Crestor for 28 days. Others were given supplements, including fish oil, cinnamon, garlic, turmeric, plant sterols or red yeast rice for the same period."  "What we found was that rosuvastatin lowered LDL cholesterol by almost 38% and that was vastly superior to placebo and any of the six supplements studied in the trial," study Despina Hidden, M.D. of the [facility], [place] told NPR. He says this level of reduction is enough to lower the risk of heart attacks and strokes. The findings are published in the Journal of  the [employer] of Cardiology.  "Oftentimes these supplements are marketed as 'natural ways' to lower your cholesterol," says Laffin. But he says none of the dietary supplements demonstrated any significant decrease in LDL cholesterol compared with a placebo. LDL cholesterol is considered the 'bad  cholesterol' because it can contribute to plaque build-up in the artery walls - which can narrow the arteries, and set the stage for heart attacks and strokes"

## 2021-09-24 DIAGNOSIS — N814 Uterovaginal prolapse, unspecified: Secondary | ICD-10-CM | POA: Insufficient documentation

## 2021-09-24 DIAGNOSIS — N281 Cyst of kidney, acquired: Secondary | ICD-10-CM | POA: Insufficient documentation

## 2021-09-24 DIAGNOSIS — F418 Other specified anxiety disorders: Secondary | ICD-10-CM | POA: Insufficient documentation

## 2021-09-24 DIAGNOSIS — M9901 Segmental and somatic dysfunction of cervical region: Secondary | ICD-10-CM | POA: Diagnosis not present

## 2021-09-24 DIAGNOSIS — E039 Hypothyroidism, unspecified: Secondary | ICD-10-CM | POA: Insufficient documentation

## 2021-09-24 DIAGNOSIS — M50323 Other cervical disc degeneration at C6-C7 level: Secondary | ICD-10-CM | POA: Diagnosis not present

## 2021-09-28 DIAGNOSIS — N952 Postmenopausal atrophic vaginitis: Secondary | ICD-10-CM | POA: Diagnosis not present

## 2021-09-28 DIAGNOSIS — Z4689 Encounter for fitting and adjustment of other specified devices: Secondary | ICD-10-CM | POA: Diagnosis not present

## 2021-09-28 DIAGNOSIS — Q6102 Congenital multiple renal cysts: Secondary | ICD-10-CM | POA: Diagnosis not present

## 2021-09-28 DIAGNOSIS — N813 Complete uterovaginal prolapse: Secondary | ICD-10-CM | POA: Diagnosis not present

## 2021-09-28 DIAGNOSIS — R8271 Bacteriuria: Secondary | ICD-10-CM | POA: Diagnosis not present

## 2021-09-28 DIAGNOSIS — N390 Urinary tract infection, site not specified: Secondary | ICD-10-CM | POA: Diagnosis not present

## 2021-09-28 DIAGNOSIS — N281 Cyst of kidney, acquired: Secondary | ICD-10-CM | POA: Diagnosis not present

## 2021-09-28 DIAGNOSIS — N3010 Interstitial cystitis (chronic) without hematuria: Secondary | ICD-10-CM | POA: Diagnosis not present

## 2021-09-28 DIAGNOSIS — N814 Uterovaginal prolapse, unspecified: Secondary | ICD-10-CM | POA: Diagnosis not present

## 2021-09-28 DIAGNOSIS — B961 Klebsiella pneumoniae [K. pneumoniae] as the cause of diseases classified elsewhere: Secondary | ICD-10-CM | POA: Diagnosis not present

## 2021-10-05 DIAGNOSIS — N644 Mastodynia: Secondary | ICD-10-CM | POA: Diagnosis not present

## 2021-10-12 ENCOUNTER — Encounter (HOSPITAL_BASED_OUTPATIENT_CLINIC_OR_DEPARTMENT_OTHER): Payer: Self-pay | Admitting: Cardiology

## 2021-11-03 DIAGNOSIS — Z4689 Encounter for fitting and adjustment of other specified devices: Secondary | ICD-10-CM | POA: Diagnosis not present

## 2021-11-03 DIAGNOSIS — N3010 Interstitial cystitis (chronic) without hematuria: Secondary | ICD-10-CM | POA: Diagnosis not present

## 2021-11-03 DIAGNOSIS — N390 Urinary tract infection, site not specified: Secondary | ICD-10-CM | POA: Diagnosis not present

## 2021-11-03 DIAGNOSIS — N281 Cyst of kidney, acquired: Secondary | ICD-10-CM | POA: Diagnosis not present

## 2021-11-03 DIAGNOSIS — N814 Uterovaginal prolapse, unspecified: Secondary | ICD-10-CM | POA: Diagnosis not present

## 2021-11-03 DIAGNOSIS — N952 Postmenopausal atrophic vaginitis: Secondary | ICD-10-CM | POA: Diagnosis not present

## 2021-12-23 DIAGNOSIS — Z23 Encounter for immunization: Secondary | ICD-10-CM | POA: Diagnosis not present

## 2021-12-23 DIAGNOSIS — E039 Hypothyroidism, unspecified: Secondary | ICD-10-CM | POA: Diagnosis not present

## 2021-12-23 DIAGNOSIS — F411 Generalized anxiety disorder: Secondary | ICD-10-CM | POA: Diagnosis not present

## 2021-12-23 DIAGNOSIS — Z0000 Encounter for general adult medical examination without abnormal findings: Secondary | ICD-10-CM | POA: Diagnosis not present

## 2021-12-23 DIAGNOSIS — Z1159 Encounter for screening for other viral diseases: Secondary | ICD-10-CM | POA: Diagnosis not present

## 2021-12-23 DIAGNOSIS — E1169 Type 2 diabetes mellitus with other specified complication: Secondary | ICD-10-CM | POA: Diagnosis not present

## 2021-12-23 DIAGNOSIS — E782 Mixed hyperlipidemia: Secondary | ICD-10-CM | POA: Diagnosis not present

## 2021-12-23 DIAGNOSIS — I10 Essential (primary) hypertension: Secondary | ICD-10-CM | POA: Diagnosis not present

## 2021-12-23 DIAGNOSIS — E538 Deficiency of other specified B group vitamins: Secondary | ICD-10-CM | POA: Diagnosis not present

## 2022-01-06 ENCOUNTER — Telehealth: Payer: Self-pay | Admitting: Cardiology

## 2022-01-06 NOTE — Telephone Encounter (Signed)
Pt c/o medication issue:  1. Name of Medication: atorvastatin (LIPITOR) 10 MG tablet   2. How are you currently taking this medication (dosage and times per day)? Need to verify if patient is taking it  3. Are you having a reaction (difficulty breathing--STAT)? no  4. What is your medication issue? [place] calling to confirm whether the medication was discontinued. Phone: [phone]

## 2022-01-06 NOTE — Telephone Encounter (Signed)
Returned call to [facility], informed them that patient should still be taking Atorvastatin '10mg'$  daily.

## 2022-01-08 NOTE — Telephone Encounter (Signed)
Spoke with Patient about  continue to take current medication Atorvastatin 10 mg .Patient stated that due to her research and intuition she decided not to take with all the side effects. She stated she exercising and watching her diet.  She appreciate all the advice and time Dr Shawna Orleans put in but she not going to continue to take Atorvastatin.

## 2022-01-08 NOTE — Telephone Encounter (Signed)
[place] is calling back to request a call to the patient to make them aware that they are to be taking atorvastatin still.

## 2022-04-28 DIAGNOSIS — D2261 Melanocytic nevi of right upper limb, including shoulder: Secondary | ICD-10-CM | POA: Diagnosis not present

## 2022-04-28 DIAGNOSIS — L720 Epidermal cyst: Secondary | ICD-10-CM | POA: Diagnosis not present

## 2022-04-28 DIAGNOSIS — L858 Other specified epidermal thickening: Secondary | ICD-10-CM | POA: Diagnosis not present

## 2022-04-28 DIAGNOSIS — L814 Other melanin hyperpigmentation: Secondary | ICD-10-CM | POA: Diagnosis not present

## 2022-04-28 DIAGNOSIS — L821 Other seborrheic keratosis: Secondary | ICD-10-CM | POA: Diagnosis not present

## 2022-04-30 DIAGNOSIS — M21612 Bunion of left foot: Secondary | ICD-10-CM | POA: Diagnosis not present

## 2022-04-30 DIAGNOSIS — M21611 Bunion of right foot: Secondary | ICD-10-CM | POA: Diagnosis not present

## 2022-05-07 DIAGNOSIS — H524 Presbyopia: Secondary | ICD-10-CM | POA: Diagnosis not present

## 2022-05-07 DIAGNOSIS — H26492 Other secondary cataract, left eye: Secondary | ICD-10-CM | POA: Diagnosis not present

## 2022-05-07 DIAGNOSIS — H40053 Ocular hypertension, bilateral: Secondary | ICD-10-CM | POA: Diagnosis not present

## 2022-05-07 DIAGNOSIS — H40023 Open angle with borderline findings, high risk, bilateral: Secondary | ICD-10-CM | POA: Diagnosis not present

## 2022-05-07 DIAGNOSIS — H04123 Dry eye syndrome of bilateral lacrimal glands: Secondary | ICD-10-CM | POA: Diagnosis not present

## 2022-06-02 DIAGNOSIS — M9903 Segmental and somatic dysfunction of lumbar region: Secondary | ICD-10-CM | POA: Diagnosis not present

## 2022-06-02 DIAGNOSIS — M5136 Other intervertebral disc degeneration, lumbar region: Secondary | ICD-10-CM | POA: Diagnosis not present

## 2022-06-02 DIAGNOSIS — M9905 Segmental and somatic dysfunction of pelvic region: Secondary | ICD-10-CM | POA: Diagnosis not present

## 2022-06-02 DIAGNOSIS — M9904 Segmental and somatic dysfunction of sacral region: Secondary | ICD-10-CM | POA: Diagnosis not present

## 2022-06-03 DIAGNOSIS — N819 Female genital prolapse, unspecified: Secondary | ICD-10-CM | POA: Diagnosis not present

## 2022-06-08 DIAGNOSIS — M9904 Segmental and somatic dysfunction of sacral region: Secondary | ICD-10-CM | POA: Diagnosis not present

## 2022-06-08 DIAGNOSIS — M9903 Segmental and somatic dysfunction of lumbar region: Secondary | ICD-10-CM | POA: Diagnosis not present

## 2022-06-08 DIAGNOSIS — M5136 Other intervertebral disc degeneration, lumbar region: Secondary | ICD-10-CM | POA: Diagnosis not present

## 2022-06-08 DIAGNOSIS — M9905 Segmental and somatic dysfunction of pelvic region: Secondary | ICD-10-CM | POA: Diagnosis not present

## 2022-06-09 DIAGNOSIS — M9904 Segmental and somatic dysfunction of sacral region: Secondary | ICD-10-CM | POA: Diagnosis not present

## 2022-06-09 DIAGNOSIS — M9903 Segmental and somatic dysfunction of lumbar region: Secondary | ICD-10-CM | POA: Diagnosis not present

## 2022-06-09 DIAGNOSIS — M5136 Other intervertebral disc degeneration, lumbar region: Secondary | ICD-10-CM | POA: Diagnosis not present

## 2022-06-09 DIAGNOSIS — M9905 Segmental and somatic dysfunction of pelvic region: Secondary | ICD-10-CM | POA: Diagnosis not present

## 2022-06-10 DIAGNOSIS — N819 Female genital prolapse, unspecified: Secondary | ICD-10-CM | POA: Diagnosis not present

## 2022-06-24 DIAGNOSIS — F411 Generalized anxiety disorder: Secondary | ICD-10-CM | POA: Diagnosis not present

## 2022-06-24 DIAGNOSIS — E039 Hypothyroidism, unspecified: Secondary | ICD-10-CM | POA: Diagnosis not present

## 2022-06-24 DIAGNOSIS — E1121 Type 2 diabetes mellitus with diabetic nephropathy: Secondary | ICD-10-CM | POA: Diagnosis not present

## 2022-06-24 DIAGNOSIS — I10 Essential (primary) hypertension: Secondary | ICD-10-CM | POA: Diagnosis not present

## 2022-06-24 DIAGNOSIS — E1169 Type 2 diabetes mellitus with other specified complication: Secondary | ICD-10-CM | POA: Diagnosis not present

## 2022-06-24 DIAGNOSIS — Z79899 Other long term (current) drug therapy: Secondary | ICD-10-CM | POA: Diagnosis not present

## 2022-07-01 DIAGNOSIS — E7800 Pure hypercholesterolemia, unspecified: Secondary | ICD-10-CM | POA: Diagnosis not present

## 2022-07-14 DIAGNOSIS — E618 Deficiency of other specified nutrient elements: Secondary | ICD-10-CM | POA: Diagnosis not present

## 2022-07-14 DIAGNOSIS — E7800 Pure hypercholesterolemia, unspecified: Secondary | ICD-10-CM | POA: Diagnosis not present

## 2022-08-10 DIAGNOSIS — Z124 Encounter for screening for malignant neoplasm of cervix: Secondary | ICD-10-CM | POA: Diagnosis not present

## 2022-08-10 DIAGNOSIS — Z6824 Body mass index (BMI) 24.0-24.9, adult: Secondary | ICD-10-CM | POA: Diagnosis not present

## 2022-08-10 DIAGNOSIS — Z1151 Encounter for screening for human papillomavirus (HPV): Secondary | ICD-10-CM | POA: Diagnosis not present

## 2022-08-10 DIAGNOSIS — N3010 Interstitial cystitis (chronic) without hematuria: Secondary | ICD-10-CM | POA: Diagnosis not present

## 2022-08-10 DIAGNOSIS — N819 Female genital prolapse, unspecified: Secondary | ICD-10-CM | POA: Diagnosis not present

## 2022-08-10 DIAGNOSIS — N644 Mastodynia: Secondary | ICD-10-CM | POA: Diagnosis not present

## 2022-08-10 DIAGNOSIS — N952 Postmenopausal atrophic vaginitis: Secondary | ICD-10-CM | POA: Diagnosis not present

## 2022-10-04 ENCOUNTER — Encounter (HOSPITAL_BASED_OUTPATIENT_CLINIC_OR_DEPARTMENT_OTHER): Payer: Self-pay | Admitting: Cardiology

## 2022-10-04 ENCOUNTER — Ambulatory Visit (HOSPITAL_BASED_OUTPATIENT_CLINIC_OR_DEPARTMENT_OTHER): Payer: No Typology Code available for payment source | Admitting: Cardiology

## 2022-10-04 VITALS — BP 134/88 | HR 85 | Ht 64.0 in | Wt 143.0 lb

## 2022-10-04 DIAGNOSIS — Z7189 Other specified counseling: Secondary | ICD-10-CM

## 2022-10-04 DIAGNOSIS — I2510 Atherosclerotic heart disease of native coronary artery without angina pectoris: Secondary | ICD-10-CM | POA: Diagnosis not present

## 2022-10-04 DIAGNOSIS — E7800 Pure hypercholesterolemia, unspecified: Secondary | ICD-10-CM | POA: Diagnosis not present

## 2022-10-04 DIAGNOSIS — Z79899 Other long term (current) drug therapy: Secondary | ICD-10-CM | POA: Diagnosis not present

## 2022-10-04 MED ORDER — ATORVASTATIN CALCIUM 10 MG PO TABS: 10.0000 mg | ORAL_TABLET | Freq: Every day | ORAL | 11 refills | Status: DC

## 2022-10-04 NOTE — Progress Notes (Signed)
Cardiology Office Note:  .    Date:  10/04/2022  ID:  [name], DOB [birth year], MRN [id number] PCP: Aliene Beams, MD  [facility] Providers Cardiologist:  Jodelle Red, MD     History of Present Illness: .    Cathy Page is a 72 y.o. female with a hx of non-obstructive CAD, hypertension, interstitial cystitis, fibromyalgia, who is seen for follow up today. I initially saw her 05/21/19 as a new patient to me at the request of Aliene Beams, MD for the evaluation and management of racing heart beats.    CV history: 03/2019, Patient and her daughter requested cardiology referral for high heart rate with anxiety and chronic chest pain. See initial consult note. 07/2019, had ER visit due to 4 hours of chest pressure, but no shortness of breath, diaphoresis. ER workup unremarkable, hsTnI 4, 4.    At her visit 09/2021, she reported occasional chest pain which she did not believe was related to her heart. Had noticed left ankle swelling several times in the month prior. She noted that sometimes she only ate one meal per day, usually grass-fed red meat, chicken, and fish. She quit smoking years ago. She asked for advanced lipid panel, ordered NMR panel. We did discuss that the guidelines recommend statins as first line. After extensive discussion, including re-discussion after her husband's visit later in the day, she was willing to try atorvastatin 10 mg daily. In 12/2021 she had decided to discontinue her atorvastatin due to her research and intuition regarding potential side effects.   Today, she states she is feeling pretty good. She wishes to discuss coronary calcium scoring. We reviewed her coronary CTA from 11/2016 which had shown a coronary calcium score of 116, with mild non-obstructive CAD. Additionally she has questions regarding lipid particle size which we reviewed in detail. Reviewed statin therapy which she declines.   Regarding her diet she has been working on  eliminating processed foods and sugars. She is walking every day for exercise.  Occasionally she notices random aches and pains in her bilateral legs, not with exertion. She states that she otherwise has good blood circulation in her legs.  She denies any palpitations, chest pain, shortness of breath, peripheral edema, lightheadedness, headaches, syncope, orthopnea, or PND.  ROS:  Please see the history of present illness. ROS otherwise negative except as noted.  (+) Occasional BLE myalgias  Studies Reviewed: Marland Kitchen    EKG Interpretation Date/Time:  Monday October 04 2022 09:10:50 EDT Ventricular Rate:  85 PR Interval:  150 QRS Duration:  80 QT Interval:  362 QTC Calculation: 430 R Axis:   76  Text Interpretation: Normal sinus rhythm Normal ECG Confirmed by Jodelle Red [phone]) on 10/04/2022 9:20:13 AM    Physical Exam:    VS:  BP 134/88   Pulse 85   Ht 5\' 4"  (1.626 m)   Wt 143 lb (64.9 kg)   SpO2 98%   BMI 24.55 kg/m    Wt Readings from Last 3 Encounters:  10/04/22 143 lb (64.9 kg)  09/23/21 137 lb 9.6 oz (62.4 kg)  07/30/19 150 lb (68 kg)    GEN: Well nourished, well developed in no acute distress HEENT: Normal, moist mucous membranes NECK: No JVD CARDIAC: regular rhythm, normal S1 and S2, no rubs or gallops. No murmur. VASCULAR: Radial and DP pulses 2+ bilaterally. No carotid bruits RESPIRATORY:  Clear to auscultation without rales, wheezing or rhonchi  ABDOMEN: Soft, non-tender, non-distended MUSCULOSKELETAL:  Ambulates independently SKIN:  Warm and dry, no edema NEUROLOGIC:  Alert and oriented x 3. No focal neuro deficits noted. PSYCHIATRIC:  Normal affect   ASSESSMENT AND PLAN: .    CT with coronary calcium, consistent with nonocclusive CAD Hypercholesterolemia: -we had extensive discussion today regarding her prior CT results, recommendations. She has done extensive research on her own and has many concerns re: statins. We discussed a year ago, and she  considered taking atorvastatin at that time, but then decided against statin -I respect her personal preferences, but we did discuss CV risk and recommendations for management -she wishes to try atorvastatin, sent in. If she does take this, would recheck lipids in 3 mos -congratulated her on lifestyle changes -no longer on aspirin per patient preference -reviewed red flag warning signs that need immediate medical attention   Hypertension: -at goal -continue lisinopril daily   History of palpitations: -ECG unremarkable -has not recurred   Cardiac risk counseling and prevention recommendations: -recommend heart healthy/Mediterranean diet, with whole grains, fruits, vegetable, fish, lean meats, nuts, and olive oil. Limit salt. -recommend moderate walking, 3-5 times/week for 30-50 minutes each session. Aim for at least 150 minutes.week. Goal should be pace of 3 miles/hours, or walking 1.5 miles in 30 minutes -recommend avoidance of tobacco products. Avoid excess alcohol.  Dispo: Follow-up in 1 year, or sooner as needed.  I,Mathew Stumpf,acting as a Neurosurgeon for [employer], MD.,have documented all relevant documentation on the behalf of Jodelle Red, MD,as directed by  Jodelle Red, MD while in the presence of Jodelle Red, MD.  I, Jodelle Red, MD, have reviewed all documentation for this visit. The documentation on 10/04/22 for the exam, diagnosis, procedures, and orders are all accurate and complete.   Signed, Jodelle Red, MD

## 2022-10-04 NOTE — Patient Instructions (Addendum)
Medication Instructions:  START ATORVASTATIN 10 MG DAILY   *If you need a refill on your cardiac medications before your next appointment, please call your pharmacy*   Lab Work: IF YOU REMAIN ON THE ATORVASTATIN WILL CHECK FASTING LABS IN ABOUT 3 MONTHS   If you have labs (blood work) drawn today and your tests are completely normal, you will receive your results only by: MyChart Message (if you have MyChart) OR A paper copy in the mail If you have any lab test that is abnormal or we need to change your treatment, we will call you to review the results.   Testing/Procedures: None   Follow-Up: At [facility], you and your health needs are our priority.  As part of our continuing mission to provide you with exceptional heart care, we have created designated Provider Care Teams.  These Care Teams include your primary Cardiologist (physician) and Advanced Practice Providers (APPs -  Physician Assistants and Nurse Practitioners) who all work together to provide you with the care you need, when you need it.  We recommend signing up for the patient portal called "MyChart".  Sign up information is provided on this After Visit Summary.  MyChart is used to connect with patients for Virtual Visits (Telemedicine).  Patients are able to view lab/test results, encounter notes, upcoming appointments, etc.  Non-urgent messages can be sent to your provider as well.   To learn more about what you can do with MyChart, go to [email or web address].    Your next appointment:   1 year(s)  Provider:   Jodelle Red, MD    Other Instructions We discussed the NMR lipid profile today (particle size). I think that the CT coronary you had in 2018 is the most helpful test, as this has already told us that you have plaque.   Coronary Arteries:  RCA is a large dominant artery that gives rise to PDA and PLVB. Mid RCA has a mild calcified plaque with associated stenosis 25-50%.   Left main is  a large artery that gives rise to LAD, ramus intermedius and LCX arteries. There is an eccentric calcified plaque at the left main ostium associated with 0-25% stenosis.   IMPRESSION: 1. Coronary calcium score of 116. This was 41 percentile for age and sex matched control.   2. Normal coronary origin with right dominance.   3. Mild non-obstructive CAD. Risk factor modification is recommended.   https://www.pharmaceutical-technology.com/news/esc-2024-new-pacman-ami-trial-analysis-sheds-insights-on-praluent-efficacy/?cf-view  [email or web address]

## 2022-10-07 NOTE — Addendum Note (Signed)
Addended by: Jodelle Red A on: 10/07/2022 10:59 AM   Modules accepted: Orders

## 2022-10-13 DIAGNOSIS — M9905 Segmental and somatic dysfunction of pelvic region: Secondary | ICD-10-CM | POA: Diagnosis not present

## 2022-10-13 DIAGNOSIS — M9903 Segmental and somatic dysfunction of lumbar region: Secondary | ICD-10-CM | POA: Diagnosis not present

## 2022-10-13 DIAGNOSIS — M9904 Segmental and somatic dysfunction of sacral region: Secondary | ICD-10-CM | POA: Diagnosis not present

## 2022-10-13 DIAGNOSIS — M5136 Other intervertebral disc degeneration, lumbar region with discogenic back pain only: Secondary | ICD-10-CM | POA: Diagnosis not present

## 2022-11-16 DIAGNOSIS — M5136 Other intervertebral disc degeneration, lumbar region with discogenic back pain only: Secondary | ICD-10-CM | POA: Diagnosis not present

## 2022-11-16 DIAGNOSIS — M9904 Segmental and somatic dysfunction of sacral region: Secondary | ICD-10-CM | POA: Diagnosis not present

## 2022-11-16 DIAGNOSIS — M9903 Segmental and somatic dysfunction of lumbar region: Secondary | ICD-10-CM | POA: Diagnosis not present

## 2022-11-16 DIAGNOSIS — M9905 Segmental and somatic dysfunction of pelvic region: Secondary | ICD-10-CM | POA: Diagnosis not present

## 2022-11-17 DIAGNOSIS — M9903 Segmental and somatic dysfunction of lumbar region: Secondary | ICD-10-CM | POA: Diagnosis not present

## 2022-11-17 DIAGNOSIS — M5136 Other intervertebral disc degeneration, lumbar region with discogenic back pain only: Secondary | ICD-10-CM | POA: Diagnosis not present

## 2022-11-17 DIAGNOSIS — M9904 Segmental and somatic dysfunction of sacral region: Secondary | ICD-10-CM | POA: Diagnosis not present

## 2022-11-17 DIAGNOSIS — M9905 Segmental and somatic dysfunction of pelvic region: Secondary | ICD-10-CM | POA: Diagnosis not present

## 2022-11-18 DIAGNOSIS — M9904 Segmental and somatic dysfunction of sacral region: Secondary | ICD-10-CM | POA: Diagnosis not present

## 2022-11-18 DIAGNOSIS — M9905 Segmental and somatic dysfunction of pelvic region: Secondary | ICD-10-CM | POA: Diagnosis not present

## 2022-11-18 DIAGNOSIS — M9903 Segmental and somatic dysfunction of lumbar region: Secondary | ICD-10-CM | POA: Diagnosis not present

## 2022-11-18 DIAGNOSIS — M5136 Other intervertebral disc degeneration, lumbar region with discogenic back pain only: Secondary | ICD-10-CM | POA: Diagnosis not present

## 2022-12-02 DIAGNOSIS — M5136 Other intervertebral disc degeneration, lumbar region with discogenic back pain only: Secondary | ICD-10-CM | POA: Diagnosis not present

## 2022-12-02 DIAGNOSIS — M9904 Segmental and somatic dysfunction of sacral region: Secondary | ICD-10-CM | POA: Diagnosis not present

## 2022-12-02 DIAGNOSIS — M9905 Segmental and somatic dysfunction of pelvic region: Secondary | ICD-10-CM | POA: Diagnosis not present

## 2022-12-02 DIAGNOSIS — M9903 Segmental and somatic dysfunction of lumbar region: Secondary | ICD-10-CM | POA: Diagnosis not present

## 2022-12-29 DIAGNOSIS — M21612 Bunion of left foot: Secondary | ICD-10-CM | POA: Diagnosis not present

## 2022-12-29 DIAGNOSIS — M21611 Bunion of right foot: Secondary | ICD-10-CM | POA: Diagnosis not present

## 2022-12-30 DIAGNOSIS — Z1231 Encounter for screening mammogram for malignant neoplasm of breast: Secondary | ICD-10-CM | POA: Diagnosis not present

## 2023-01-19 DIAGNOSIS — I2510 Atherosclerotic heart disease of native coronary artery without angina pectoris: Secondary | ICD-10-CM | POA: Diagnosis not present

## 2023-01-19 DIAGNOSIS — E039 Hypothyroidism, unspecified: Secondary | ICD-10-CM | POA: Diagnosis not present

## 2023-01-19 DIAGNOSIS — R0789 Other chest pain: Secondary | ICD-10-CM | POA: Diagnosis not present

## 2023-01-19 DIAGNOSIS — E2839 Other primary ovarian failure: Secondary | ICD-10-CM | POA: Diagnosis not present

## 2023-01-19 DIAGNOSIS — E538 Deficiency of other specified B group vitamins: Secondary | ICD-10-CM | POA: Diagnosis not present

## 2023-01-19 DIAGNOSIS — E7800 Pure hypercholesterolemia, unspecified: Secondary | ICD-10-CM | POA: Diagnosis not present

## 2023-01-19 DIAGNOSIS — I10 Essential (primary) hypertension: Secondary | ICD-10-CM | POA: Diagnosis not present

## 2023-01-19 DIAGNOSIS — Z0000 Encounter for general adult medical examination without abnormal findings: Secondary | ICD-10-CM | POA: Diagnosis not present

## 2023-01-19 DIAGNOSIS — F5101 Primary insomnia: Secondary | ICD-10-CM | POA: Diagnosis not present

## 2023-01-19 DIAGNOSIS — E559 Vitamin D deficiency, unspecified: Secondary | ICD-10-CM | POA: Diagnosis not present

## 2023-01-19 DIAGNOSIS — E1169 Type 2 diabetes mellitus with other specified complication: Secondary | ICD-10-CM | POA: Diagnosis not present

## 2023-01-19 DIAGNOSIS — F411 Generalized anxiety disorder: Secondary | ICD-10-CM | POA: Diagnosis not present

## 2023-01-20 ENCOUNTER — Telehealth (HOSPITAL_BASED_OUTPATIENT_CLINIC_OR_DEPARTMENT_OTHER): Payer: Self-pay | Admitting: Cardiology

## 2023-01-20 NOTE — Telephone Encounter (Signed)
 Called patient due to receiving a referral that states:  Refer to cardiology, chest tightness, follow-up for coronary artery disease, patient of Dr. Lonni, request follow-up with Dr. Shlomo, seen in the past.  LVMTCB to confirm before submitting provider switch.

## 2023-03-01 DIAGNOSIS — H524 Presbyopia: Secondary | ICD-10-CM | POA: Diagnosis not present

## 2023-03-01 DIAGNOSIS — H26492 Other secondary cataract, left eye: Secondary | ICD-10-CM | POA: Diagnosis not present

## 2023-03-01 DIAGNOSIS — Z961 Presence of intraocular lens: Secondary | ICD-10-CM | POA: Diagnosis not present

## 2023-03-22 ENCOUNTER — Encounter: Payer: Self-pay | Admitting: Cardiology

## 2023-04-07 ENCOUNTER — Ambulatory Visit: Admitting: Podiatry

## 2023-04-11 DIAGNOSIS — M21622 Bunionette of left foot: Secondary | ICD-10-CM | POA: Diagnosis not present

## 2023-04-11 DIAGNOSIS — M21621 Bunionette of right foot: Secondary | ICD-10-CM | POA: Diagnosis not present

## 2023-04-12 ENCOUNTER — Encounter: Payer: Self-pay | Admitting: Podiatry

## 2023-04-12 ENCOUNTER — Ambulatory Visit (INDEPENDENT_AMBULATORY_CARE_PROVIDER_SITE_OTHER): Admitting: Podiatry

## 2023-04-12 DIAGNOSIS — L600 Ingrowing nail: Secondary | ICD-10-CM | POA: Diagnosis not present

## 2023-04-12 DIAGNOSIS — L603 Nail dystrophy: Secondary | ICD-10-CM | POA: Diagnosis not present

## 2023-04-12 DIAGNOSIS — B351 Tinea unguium: Secondary | ICD-10-CM | POA: Diagnosis not present

## 2023-04-12 MED ORDER — NEOMYCIN-POLYMYXIN-HC 1 % OT SOLN: OTIC | 1 refills | Status: DC

## 2023-04-12 NOTE — Patient Instructions (Signed)

## 2023-04-12 NOTE — Progress Notes (Signed)
 Subjective:  Patient ID: Cathy Page, female    DOB: 04-Aug-1950,  MRN: [id number] HPI Chief Complaint  Patient presents with   Toe Pain    1st and 4th toes left - medial border - thinks she has an ingrown toenail, tender, notice the toenails get discolored too, she is having left foot surgery with Ortho 04/26/23 on 5th MPJ, pre-diabetic - 5.7   New Patient (Initial Visit)    73 y.o. female presents with the above complaint.   ROS: Denies fever chills nausea vomit muscle aches pains calf pain back pain chest pain shortness of breath.  Past Medical History:  Diagnosis Date   Anxiety    Bilateral renal cysts    simple   Diverticulosis    Endometrial polyp    Fibromyalgia    GERD (gastroesophageal reflux disease)    History of palpitations    cardiologist consult 2013 w/ dr Gloris [place] turner-- felt to be due to increasing thyroid medication on her own   Hx of leukocytosis 11/30/2012   Hyperlipidemia    Hypertension    Interstitial cystitis    Chronic, intermittent   Pre-diabetes    Type 2 diabetes mellitus (HCC)    Wears partial dentures    lower   Past Surgical History:  Procedure Laterality Date   CARDIOVASCULAR STRESS TEST  01/21/2016   normal nuclear study w/ no ischemia/  normal LV function and wall motion , stress ef 74%   CESAREAN SECTION  x3  last one 1980   BILATERAL TUBAL LIGATION  W/ LAST C/S   COLONOSCOPY  last one 2014   D & C  FAILED HYSTEROSCOPY  04-23-2003  dr Sydnee Cabal   severe anteverted uterus   DILATATION & CURETTAGE/HYSTEROSCOPY WITH MYOSURE N/A 01/27/2016   Procedure: DILATATION & CURETTAGE/HYSTEROSCOPY WITH MYOSURE AND ENDOMETRIAL CURETTAGE;  Surgeon: Ranae Pila, MD;  Location: [facility];  Service: Gynecology;  Laterality: N/A;   EXPLORATORY LAPAROTOMY/  OVARIAN CYSTECTOMY  1976   w/  APPENDECTOMY    Current Outpatient Medications:    Continuous Glucose Receiver (FREESTYLE LIBRE 3 READER) DEVI, USE TO MONITOR BLOOD  GLUCOSE CONTINUOUSLY AS DIRECTED, Disp: , Rfl:    Continuous Glucose Sensor (FREESTYLE LIBRE 3 SENSOR) MISC, APPLY SENSOR TO THE SKIN AND CHANGE EVERY 14 DAYS, Disp: , Rfl:    NEOMYCIN-POLYMYXIN-HYDROCORTISONE (CORTISPORIN) 1 % SOLN OTIC solution, Apply 1-2 drops to toe BID after soaking, Disp: 10 mL, Rfl: 1   traZODone (DESYREL) 100 MG tablet, Take 100-150 mg by mouth at bedtime., Disp: , Rfl:    ALPRAZolam (XANAX) 1 MG tablet, Take 0.5 mg by mouth 2 (two) times daily as needed for anxiety., Disp: , Rfl:    Cholecalciferol (VITAMIN D-3) 1000 UNITS CAPS, Take 2,000 Units by mouth daily. , Disp: , Rfl:    Coenzyme Q10 (CO Q 10 PO), Take 2 tablets by mouth daily. , Disp: , Rfl:    diphenhydrAMINE (BENADRYL) 25 mg capsule, Take 50 mg by mouth daily as needed for allergies. , Disp: , Rfl:    fish oil-omega-3 fatty acids 1000 MG capsule, Take 3 g by mouth daily. , Disp: , Rfl:    levothyroxine (SYNTHROID, LEVOTHROID) 50 MCG tablet, Take 1 tablet daily by mouth., Disp: , Rfl: 2   liothyronine (CYTOMEL) 5 MCG tablet, Take 10 mcg by mouth daily. , Disp: , Rfl:    lisinopril (ZESTRIL) 5 MG tablet, Take 5 mg by mouth daily. , Disp: , Rfl:    Magnesium  Bisglycinate (MAG GLYCINATE) 100 MG TABS, Take 2 tablets by mouth daily., Disp: , Rfl:    Multiple Vitamins-Minerals (MULTIVITAMIN PO), Take 1 tablet by mouth daily., Disp: , Rfl:    Probiotic Product (PROBIOTIC DAILY) CAPS, Take 2 capsules by mouth daily., Disp: , Rfl:    Zinc Gluconate 30 MG TABS, Take 30 mg by mouth daily. , Disp: , Rfl:   Allergies  Allergen Reactions   Sulfa Antibiotics Anaphylaxis, Hives, Shortness Of Breath and Swelling   Codeine Nausea And Vomiting   Hydrocodone Nausea And Vomiting   Review of Systems Objective:  There were no vitals filed for this visit.  General: Well developed, nourished, in no acute distress, alert and oriented x3   Dermatological: Skin is warm, dry and supple bilateral. Nails x 10 are well maintained;  remaining integument appears unremarkable at this time. There are no open sores, no preulcerative lesions, no rash or signs of infection present.  Hallux nail left sharply incurvated.  Mild erythema no cellulitis drainage or odor.  She also has a nail dystrophy to the nails 1 through 5 bilateral cannot rule out onychomycosis.  Vascular: Dorsalis Pedis artery and Posterior Tibial artery pedal pulses are 2/4 bilateral with immedate capillary fill time. Pedal hair growth present. No varicosities and no lower extremity edema present bilateral.   Neruologic: Grossly intact via light touch bilateral. Vibratory intact via tuning fork bilateral. Protective threshold with Semmes Wienstein monofilament intact to all pedal sites bilateral. Patellar and Achilles deep tendon reflexes 2+ bilateral. No Babinski or clonus noted bilateral.   Musculoskeletal: No gross boney pedal deformities bilateral. No pain, crepitus, or limitation noted with foot and ankle range of motion bilateral. Muscular strength 5/5 in all groups tested bilateral.  Tailor's bunion deformity present left.  Gait: Unassisted, Nonantalgic.    Radiographs:  None taken  Assessment & Plan:   Assessment: Ingrown toenail hallux left  Plan: Performed a partial temporary nail avulsion to the tibial-fibular border of the hallux left today after local anesthetic was administered this should go on to heal up uneventfully prior to her surgery on her fifth metatarsal head.  She was given both oral and written home instructions given soaking of the toe as well as prescription Cortisporin Otic to be applied twice daily after soaking.  Samples of the other nails were taken today for pathologic evaluation.     Janari Yamada T. [place], [place]

## 2023-04-25 ENCOUNTER — Other Ambulatory Visit: Payer: Self-pay | Admitting: Podiatry

## 2023-04-26 DIAGNOSIS — M7752 Other enthesopathy of left foot: Secondary | ICD-10-CM | POA: Diagnosis not present

## 2023-04-26 DIAGNOSIS — G8918 Other acute postprocedural pain: Secondary | ICD-10-CM | POA: Diagnosis not present

## 2023-04-26 DIAGNOSIS — M7742 Metatarsalgia, left foot: Secondary | ICD-10-CM | POA: Diagnosis not present

## 2023-04-26 DIAGNOSIS — M21622 Bunionette of left foot: Secondary | ICD-10-CM | POA: Diagnosis not present

## 2023-05-10 ENCOUNTER — Ambulatory Visit: Admitting: Podiatry

## 2023-05-10 DIAGNOSIS — M21622 Bunionette of left foot: Secondary | ICD-10-CM | POA: Diagnosis not present

## 2023-05-11 DIAGNOSIS — I10 Essential (primary) hypertension: Secondary | ICD-10-CM | POA: Diagnosis not present

## 2023-05-11 DIAGNOSIS — Z79899 Other long term (current) drug therapy: Secondary | ICD-10-CM | POA: Diagnosis not present

## 2023-05-11 DIAGNOSIS — E039 Hypothyroidism, unspecified: Secondary | ICD-10-CM | POA: Diagnosis not present

## 2023-05-11 DIAGNOSIS — F411 Generalized anxiety disorder: Secondary | ICD-10-CM | POA: Diagnosis not present

## 2023-05-11 DIAGNOSIS — R450 Nervousness: Secondary | ICD-10-CM | POA: Diagnosis not present

## 2023-05-12 ENCOUNTER — Ambulatory Visit (INDEPENDENT_AMBULATORY_CARE_PROVIDER_SITE_OTHER): Admitting: Podiatry

## 2023-05-12 ENCOUNTER — Encounter: Payer: Self-pay | Admitting: Podiatry

## 2023-05-12 DIAGNOSIS — Z9889 Other specified postprocedural states: Secondary | ICD-10-CM

## 2023-05-12 DIAGNOSIS — L603 Nail dystrophy: Secondary | ICD-10-CM | POA: Diagnosis not present

## 2023-05-12 DIAGNOSIS — L600 Ingrowing nail: Secondary | ICD-10-CM

## 2023-05-12 MED ORDER — TERBINAFINE HCL 250 MG PO TABS: 250.0000 mg | ORAL_TABLET | Freq: Every day | ORAL | 0 refills | Status: AC

## 2023-05-12 NOTE — Progress Notes (Signed)
 She presents today for follow-up of her matrixectomy hallux left.  She states that is doing quite well no problems whatsoever.  She is also here for her follow-up of her nail pathology.  Objective: Pleasant stable orient x 3 well-healed matrixectomy.  She does have dry xerotic feet dry cracked skin nail has gone on to heal uneventfully pathology does demonstrate tinea pedis and Trichophyton rubrum.  Assessment: Onychomycosis well-healing surgical toe.  Plan: Discussed laser therapy topical therapy and oral therapy.  At this point she would like to start with Lamisil  250 mg tablets 1 p.o. daily I will follow-up with her in 30 days.  We did discuss pros and cons of this medication possible side effects.

## 2023-06-03 DIAGNOSIS — N819 Female genital prolapse, unspecified: Secondary | ICD-10-CM | POA: Diagnosis not present

## 2023-06-10 DIAGNOSIS — M21622 Bunionette of left foot: Secondary | ICD-10-CM | POA: Diagnosis not present

## 2023-06-14 DIAGNOSIS — M9905 Segmental and somatic dysfunction of pelvic region: Secondary | ICD-10-CM | POA: Diagnosis not present

## 2023-06-14 DIAGNOSIS — M9903 Segmental and somatic dysfunction of lumbar region: Secondary | ICD-10-CM | POA: Diagnosis not present

## 2023-06-14 DIAGNOSIS — M5136 Other intervertebral disc degeneration, lumbar region with discogenic back pain only: Secondary | ICD-10-CM | POA: Diagnosis not present

## 2023-06-14 DIAGNOSIS — M9904 Segmental and somatic dysfunction of sacral region: Secondary | ICD-10-CM | POA: Diagnosis not present

## 2023-06-15 DIAGNOSIS — M9904 Segmental and somatic dysfunction of sacral region: Secondary | ICD-10-CM | POA: Diagnosis not present

## 2023-06-15 DIAGNOSIS — M5136 Other intervertebral disc degeneration, lumbar region with discogenic back pain only: Secondary | ICD-10-CM | POA: Diagnosis not present

## 2023-06-15 DIAGNOSIS — M9905 Segmental and somatic dysfunction of pelvic region: Secondary | ICD-10-CM | POA: Diagnosis not present

## 2023-06-15 DIAGNOSIS — M9903 Segmental and somatic dysfunction of lumbar region: Secondary | ICD-10-CM | POA: Diagnosis not present

## 2023-07-04 DIAGNOSIS — M9905 Segmental and somatic dysfunction of pelvic region: Secondary | ICD-10-CM | POA: Diagnosis not present

## 2023-07-04 DIAGNOSIS — M5136 Other intervertebral disc degeneration, lumbar region with discogenic back pain only: Secondary | ICD-10-CM | POA: Diagnosis not present

## 2023-07-04 DIAGNOSIS — M9903 Segmental and somatic dysfunction of lumbar region: Secondary | ICD-10-CM | POA: Diagnosis not present

## 2023-07-04 DIAGNOSIS — M9904 Segmental and somatic dysfunction of sacral region: Secondary | ICD-10-CM | POA: Diagnosis not present

## 2023-07-05 DIAGNOSIS — M9905 Segmental and somatic dysfunction of pelvic region: Secondary | ICD-10-CM | POA: Diagnosis not present

## 2023-07-05 DIAGNOSIS — M9903 Segmental and somatic dysfunction of lumbar region: Secondary | ICD-10-CM | POA: Diagnosis not present

## 2023-07-05 DIAGNOSIS — M5136 Other intervertebral disc degeneration, lumbar region with discogenic back pain only: Secondary | ICD-10-CM | POA: Diagnosis not present

## 2023-07-05 DIAGNOSIS — M9904 Segmental and somatic dysfunction of sacral region: Secondary | ICD-10-CM | POA: Diagnosis not present

## 2023-07-06 DIAGNOSIS — M21622 Bunionette of left foot: Secondary | ICD-10-CM | POA: Diagnosis not present

## 2023-07-19 DIAGNOSIS — I10 Essential (primary) hypertension: Secondary | ICD-10-CM | POA: Diagnosis not present

## 2023-07-19 DIAGNOSIS — E673 Hypervitaminosis D: Secondary | ICD-10-CM | POA: Diagnosis not present

## 2023-07-19 DIAGNOSIS — E039 Hypothyroidism, unspecified: Secondary | ICD-10-CM | POA: Diagnosis not present

## 2023-07-19 DIAGNOSIS — F411 Generalized anxiety disorder: Secondary | ICD-10-CM | POA: Diagnosis not present

## 2023-07-19 DIAGNOSIS — E1169 Type 2 diabetes mellitus with other specified complication: Secondary | ICD-10-CM | POA: Diagnosis not present

## 2023-07-19 DIAGNOSIS — E678 Other specified hyperalimentation: Secondary | ICD-10-CM | POA: Diagnosis not present

## 2023-07-19 DIAGNOSIS — E7800 Pure hypercholesterolemia, unspecified: Secondary | ICD-10-CM | POA: Diagnosis not present

## 2023-07-22 DIAGNOSIS — M21622 Bunionette of left foot: Secondary | ICD-10-CM | POA: Diagnosis not present

## 2023-08-11 DIAGNOSIS — F411 Generalized anxiety disorder: Secondary | ICD-10-CM | POA: Diagnosis not present

## 2023-08-11 DIAGNOSIS — E1169 Type 2 diabetes mellitus with other specified complication: Secondary | ICD-10-CM | POA: Diagnosis not present

## 2023-08-11 DIAGNOSIS — I2510 Atherosclerotic heart disease of native coronary artery without angina pectoris: Secondary | ICD-10-CM | POA: Diagnosis not present

## 2023-08-11 DIAGNOSIS — E1121 Type 2 diabetes mellitus with diabetic nephropathy: Secondary | ICD-10-CM | POA: Diagnosis not present

## 2023-08-14 DIAGNOSIS — R7303 Prediabetes: Secondary | ICD-10-CM | POA: Diagnosis not present

## 2023-08-14 DIAGNOSIS — N3090 Cystitis, unspecified without hematuria: Secondary | ICD-10-CM | POA: Diagnosis not present

## 2023-09-20 DIAGNOSIS — M9905 Segmental and somatic dysfunction of pelvic region: Secondary | ICD-10-CM | POA: Diagnosis not present

## 2023-09-20 DIAGNOSIS — M9904 Segmental and somatic dysfunction of sacral region: Secondary | ICD-10-CM | POA: Diagnosis not present

## 2023-09-20 DIAGNOSIS — M51361 Other intervertebral disc degeneration, lumbar region with lower extremity pain only: Secondary | ICD-10-CM | POA: Diagnosis not present

## 2023-09-20 DIAGNOSIS — M9903 Segmental and somatic dysfunction of lumbar region: Secondary | ICD-10-CM | POA: Diagnosis not present

## 2024-01-10 ENCOUNTER — Ambulatory Visit (INDEPENDENT_AMBULATORY_CARE_PROVIDER_SITE_OTHER): Admitting: Podiatry

## 2024-01-10 ENCOUNTER — Encounter: Payer: Self-pay | Admitting: Podiatry

## 2024-01-10 DIAGNOSIS — M2042 Other hammer toe(s) (acquired), left foot: Secondary | ICD-10-CM

## 2024-01-10 DIAGNOSIS — D2372 Other benign neoplasm of skin of left lower limb, including hip: Secondary | ICD-10-CM

## 2024-01-10 DIAGNOSIS — L600 Ingrowing nail: Secondary | ICD-10-CM

## 2024-01-10 MED ORDER — NEOMYCIN-POLYMYXIN-HC 1 % OT SOLN: OTIC | 1 refills | Status: AC

## 2024-01-10 NOTE — Patient Instructions (Signed)

## 2024-01-10 NOTE — Progress Notes (Signed)
 She presents today chief complaint of a painful ingrown toenail fibular border of the third digit left foot.  Also complaining of painful callus to the end of the fourth toe right foot.  Objective: Vital signs are stable alert and oriented x 3.  Pulses are palpable bilateral.  Hammertoe deformities bilateral with adductovarus rotation of the hammertoes resulting in benign lesion fourth digit left foot.  She also has a painful ingrown toenail to the third digit lateral border left foot.  This does demonstrate mild erythema no cellulitis drainage or odor no purulence no malodor..  Assessment: Pain in limb secondary to ingrown toenail fibular border third digit left foot and benign neoplasm plantar aspect right foot.  Plan: Debrided benign skin lesion right foot.  Chemical destruction fibular border ingrown toenail third digit left foot this was performed after local anesthetic was administered.  It was then prepped and draped in normal sterile fashion the nail was split along the fibular border and avulsed exposing the matrix and the nailbed.  3 applications of phenol were applied to the matrix and the nailbed.  30 seconds each.  It was neutralized with isopropyl alcohol and a dry sterile compressive dressing was applied both oral and written home-going instructions were provided.  Also provided a prescription for Cortisporin Otic to be applied twice daily after soaking.  Follow-up with us  in 2 weeks

## 2024-01-31 ENCOUNTER — Ambulatory Visit: Admitting: Podiatry
